# Patient Record
Sex: Male | Born: 1960
Health system: Southern US, Community
[De-identification: ages and names within clinical notes are randomized; demographics above are authoritative.]

## PROBLEM LIST (undated history)

## (undated) DIAGNOSIS — T7840XA Allergy, unspecified, initial encounter: Secondary | ICD-10-CM

## (undated) DIAGNOSIS — E669 Obesity, unspecified: Secondary | ICD-10-CM

## (undated) DIAGNOSIS — M1990 Unspecified osteoarthritis, unspecified site: Secondary | ICD-10-CM

## (undated) DIAGNOSIS — I739 Peripheral vascular disease, unspecified: Secondary | ICD-10-CM

## (undated) DIAGNOSIS — K219 Gastro-esophageal reflux disease without esophagitis: Secondary | ICD-10-CM

## (undated) DIAGNOSIS — I10 Essential (primary) hypertension: Secondary | ICD-10-CM

## (undated) DIAGNOSIS — M25519 Pain in unspecified shoulder: Secondary | ICD-10-CM

## (undated) DIAGNOSIS — C801 Malignant (primary) neoplasm, unspecified: Secondary | ICD-10-CM

## (undated) DIAGNOSIS — R7309 Other abnormal glucose: Secondary | ICD-10-CM

## (undated) DIAGNOSIS — E785 Hyperlipidemia, unspecified: Secondary | ICD-10-CM

## (undated) DIAGNOSIS — J310 Chronic rhinitis: Secondary | ICD-10-CM

## (undated) DIAGNOSIS — M542 Cervicalgia: Secondary | ICD-10-CM

## (undated) HISTORY — PX: KNEE SURGERY: SHX244

## (undated) HISTORY — DX: Malignant (primary) neoplasm, unspecified: C80.1

## (undated) HISTORY — DX: Allergy, unspecified, initial encounter: T78.40XA

## (undated) HISTORY — DX: Other abnormal glucose: R73.09

## (undated) HISTORY — DX: Gastro-esophageal reflux disease without esophagitis: K21.9

## (undated) HISTORY — DX: Hyperlipidemia, unspecified: E78.5

## (undated) HISTORY — PX: WISDOM TOOTH EXTRACTION: SHX21

## (undated) HISTORY — DX: Peripheral vascular disease, unspecified: I73.9

## (undated) HISTORY — DX: Chronic rhinitis: J31.0

## (undated) HISTORY — DX: Obesity, unspecified: E66.9

## (undated) HISTORY — DX: Cervicalgia: M54.2

## (undated) HISTORY — DX: Unspecified osteoarthritis, unspecified site: M19.90

## (undated) HISTORY — PX: SHOULDER SURGERY: SHX246

## (undated) HISTORY — DX: Essential (primary) hypertension: I10

## (undated) HISTORY — DX: Pain in unspecified shoulder: M25.519

---

## 2000-12-08 ENCOUNTER — Encounter: Admission: RE | Admit: 2000-12-08 | Discharge: 2000-12-08 | Payer: Self-pay | Admitting: Emergency Medicine

## 2000-12-08 ENCOUNTER — Encounter: Payer: Self-pay | Admitting: Emergency Medicine

## 2001-03-20 ENCOUNTER — Encounter: Admission: RE | Admit: 2001-03-20 | Discharge: 2001-03-20 | Payer: Self-pay | Admitting: Neurosurgery

## 2001-03-27 ENCOUNTER — Encounter: Payer: Self-pay | Admitting: Neurosurgery

## 2001-03-27 ENCOUNTER — Encounter: Admission: RE | Admit: 2001-03-27 | Discharge: 2001-03-27 | Payer: Self-pay | Admitting: Neurosurgery

## 2001-04-20 ENCOUNTER — Encounter: Payer: Self-pay | Admitting: Neurosurgery

## 2001-04-20 ENCOUNTER — Inpatient Hospital Stay (HOSPITAL_COMMUNITY): Admission: RE | Admit: 2001-04-20 | Discharge: 2001-04-20 | Payer: Self-pay | Admitting: Neurosurgery

## 2004-11-02 ENCOUNTER — Ambulatory Visit (HOSPITAL_BASED_OUTPATIENT_CLINIC_OR_DEPARTMENT_OTHER): Admission: RE | Admit: 2004-11-02 | Discharge: 2004-11-02 | Payer: Self-pay | Admitting: Orthopedic Surgery

## 2004-11-02 ENCOUNTER — Ambulatory Visit (HOSPITAL_COMMUNITY): Admission: RE | Admit: 2004-11-02 | Discharge: 2004-11-02 | Payer: Self-pay | Admitting: Orthopedic Surgery

## 2006-09-28 ENCOUNTER — Ambulatory Visit: Payer: Self-pay | Admitting: Vascular Surgery

## 2006-11-14 ENCOUNTER — Ambulatory Visit: Payer: Self-pay | Admitting: Vascular Surgery

## 2007-09-05 ENCOUNTER — Ambulatory Visit: Payer: Self-pay | Admitting: Vascular Surgery

## 2008-01-02 DIAGNOSIS — I4510 Unspecified right bundle-branch block: Secondary | ICD-10-CM | POA: Insufficient documentation

## 2008-07-12 ENCOUNTER — Encounter: Payer: Self-pay | Admitting: Family Medicine

## 2008-09-03 ENCOUNTER — Ambulatory Visit: Payer: Self-pay | Admitting: Vascular Surgery

## 2008-11-20 ENCOUNTER — Ambulatory Visit: Payer: Self-pay | Admitting: Family Medicine

## 2008-11-20 DIAGNOSIS — E785 Hyperlipidemia, unspecified: Secondary | ICD-10-CM | POA: Insufficient documentation

## 2008-11-20 DIAGNOSIS — K219 Gastro-esophageal reflux disease without esophagitis: Secondary | ICD-10-CM

## 2008-11-20 DIAGNOSIS — R030 Elevated blood-pressure reading, without diagnosis of hypertension: Secondary | ICD-10-CM | POA: Insufficient documentation

## 2008-11-20 HISTORY — DX: Gastro-esophageal reflux disease without esophagitis: K21.9

## 2008-11-20 HISTORY — DX: Hyperlipidemia, unspecified: E78.5

## 2009-01-24 ENCOUNTER — Encounter: Payer: Self-pay | Admitting: Family Medicine

## 2009-01-24 DIAGNOSIS — E669 Obesity, unspecified: Secondary | ICD-10-CM

## 2009-01-24 DIAGNOSIS — I739 Peripheral vascular disease, unspecified: Secondary | ICD-10-CM | POA: Insufficient documentation

## 2009-01-24 DIAGNOSIS — Z87448 Personal history of other diseases of urinary system: Secondary | ICD-10-CM | POA: Insufficient documentation

## 2009-01-24 HISTORY — DX: Obesity, unspecified: E66.9

## 2009-01-24 HISTORY — DX: Peripheral vascular disease, unspecified: I73.9

## 2009-03-24 ENCOUNTER — Encounter: Admission: RE | Admit: 2009-03-24 | Discharge: 2009-03-24 | Payer: Self-pay | Admitting: Otolaryngology

## 2009-04-03 ENCOUNTER — Ambulatory Visit: Payer: Self-pay | Admitting: Family Medicine

## 2009-04-03 DIAGNOSIS — J310 Chronic rhinitis: Secondary | ICD-10-CM | POA: Insufficient documentation

## 2009-04-03 HISTORY — DX: Chronic rhinitis: J31.0

## 2009-06-04 ENCOUNTER — Encounter: Admission: RE | Admit: 2009-06-04 | Discharge: 2009-06-04 | Payer: Self-pay | Admitting: Occupational Medicine

## 2009-08-25 ENCOUNTER — Telehealth: Payer: Self-pay | Admitting: Family Medicine

## 2009-09-05 ENCOUNTER — Encounter: Admission: RE | Admit: 2009-09-05 | Discharge: 2009-09-05 | Payer: Self-pay | Admitting: Orthopedic Surgery

## 2009-09-10 ENCOUNTER — Ambulatory Visit: Payer: Self-pay | Admitting: Vascular Surgery

## 2009-11-05 ENCOUNTER — Encounter: Admission: RE | Admit: 2009-11-05 | Discharge: 2009-11-05 | Payer: Self-pay | Admitting: Orthopedic Surgery

## 2009-11-19 ENCOUNTER — Encounter: Admission: RE | Admit: 2009-11-19 | Discharge: 2009-11-19 | Payer: Self-pay | Admitting: Orthopedic Surgery

## 2009-11-19 ENCOUNTER — Encounter: Admission: RE | Admit: 2009-11-19 | Discharge: 2009-11-19 | Payer: Self-pay | Admitting: Neurological Surgery

## 2009-11-26 ENCOUNTER — Ambulatory Visit: Payer: Self-pay | Admitting: Family Medicine

## 2009-11-26 DIAGNOSIS — M25519 Pain in unspecified shoulder: Secondary | ICD-10-CM

## 2009-11-26 HISTORY — DX: Pain in unspecified shoulder: M25.519

## 2009-11-26 LAB — CONVERTED CEMR LAB: HDL goal, serum: 40 mg/dL

## 2009-12-17 ENCOUNTER — Ambulatory Visit: Payer: Self-pay | Admitting: Family Medicine

## 2009-12-17 DIAGNOSIS — M542 Cervicalgia: Secondary | ICD-10-CM | POA: Insufficient documentation

## 2009-12-17 HISTORY — DX: Cervicalgia: M54.2

## 2009-12-30 ENCOUNTER — Encounter: Payer: Self-pay | Admitting: Family Medicine

## 2010-01-24 ENCOUNTER — Encounter: Admission: RE | Admit: 2010-01-24 | Discharge: 2010-01-24 | Payer: Self-pay | Admitting: *Deleted

## 2010-03-13 ENCOUNTER — Ambulatory Visit: Payer: Self-pay | Admitting: Family Medicine

## 2010-03-13 LAB — CONVERTED CEMR LAB
ALT: 40 units/L (ref 0–53)
AST: 27 units/L (ref 0–37)
Albumin: 4.2 g/dL (ref 3.5–5.2)
Alkaline Phosphatase: 73 units/L (ref 39–117)
Basophils Absolute: 0 10*3/uL (ref 0.0–0.1)
Bilirubin, Direct: 0.2 mg/dL (ref 0.0–0.3)
Calcium: 9.3 mg/dL (ref 8.4–10.5)
Cholesterol: 128 mg/dL (ref 0–200)
Creatinine, Ser: 0.9 mg/dL (ref 0.4–1.5)
Eosinophils Relative: 7.6 % — ABNORMAL HIGH (ref 0.0–5.0)
Glucose, Urine, Semiquant: NEGATIVE
HCT: 48.4 % (ref 39.0–52.0)
Hemoglobin: 16.4 g/dL (ref 13.0–17.0)
Ketones, urine, test strip: NEGATIVE
LDL Cholesterol: 64 mg/dL (ref 0–99)
MCV: 86.3 fL (ref 78.0–100.0)
Monocytes Absolute: 0.5 10*3/uL (ref 0.1–1.0)
Neutro Abs: 3.5 10*3/uL (ref 1.4–7.7)
Neutrophils Relative %: 54.4 % (ref 43.0–77.0)
PSA: 1.39 ng/mL (ref 0.10–4.00)
Protein, U semiquant: NEGATIVE
RBC: 5.6 M/uL (ref 4.22–5.81)
Specific Gravity, Urine: 1.01
TSH: 0.6 microintl units/mL (ref 0.35–5.50)
Total CHOL/HDL Ratio: 4
Total Protein: 6.9 g/dL (ref 6.0–8.3)
Triglycerides: 153 mg/dL — ABNORMAL HIGH (ref 0.0–149.0)
VLDL: 30.6 mg/dL (ref 0.0–40.0)
WBC Urine, dipstick: NEGATIVE

## 2010-04-10 ENCOUNTER — Ambulatory Visit: Payer: Self-pay | Admitting: Family Medicine

## 2010-04-10 DIAGNOSIS — R21 Rash and other nonspecific skin eruption: Secondary | ICD-10-CM | POA: Insufficient documentation

## 2010-04-21 ENCOUNTER — Encounter: Payer: Self-pay | Admitting: Gastroenterology

## 2010-05-28 ENCOUNTER — Encounter (INDEPENDENT_AMBULATORY_CARE_PROVIDER_SITE_OTHER): Payer: Self-pay | Admitting: *Deleted

## 2010-05-29 ENCOUNTER — Ambulatory Visit: Payer: Self-pay | Admitting: Gastroenterology

## 2010-06-10 ENCOUNTER — Ambulatory Visit: Payer: Self-pay | Admitting: Gastroenterology

## 2010-06-12 ENCOUNTER — Ambulatory Visit: Payer: Self-pay | Admitting: Family Medicine

## 2010-06-12 ENCOUNTER — Encounter: Payer: Self-pay | Admitting: Gastroenterology

## 2010-06-12 DIAGNOSIS — R7303 Prediabetes: Secondary | ICD-10-CM | POA: Insufficient documentation

## 2010-06-12 DIAGNOSIS — R7309 Other abnormal glucose: Secondary | ICD-10-CM

## 2010-06-12 HISTORY — DX: Other abnormal glucose: R73.09

## 2010-09-02 ENCOUNTER — Telehealth: Payer: Self-pay | Admitting: Family Medicine

## 2010-09-23 ENCOUNTER — Ambulatory Visit: Admit: 2010-09-23 | Payer: Self-pay | Admitting: Vascular Surgery

## 2010-09-23 ENCOUNTER — Encounter (INDEPENDENT_AMBULATORY_CARE_PROVIDER_SITE_OTHER): Payer: 59

## 2010-09-23 ENCOUNTER — Ambulatory Visit (INDEPENDENT_AMBULATORY_CARE_PROVIDER_SITE_OTHER): Payer: 59 | Admitting: Vascular Surgery

## 2010-09-23 DIAGNOSIS — I70219 Atherosclerosis of native arteries of extremities with intermittent claudication, unspecified extremity: Secondary | ICD-10-CM

## 2010-09-23 DIAGNOSIS — I739 Peripheral vascular disease, unspecified: Secondary | ICD-10-CM

## 2010-09-24 NOTE — Assessment & Plan Note (Signed)
Summary: 2 MONTH ROV/PT WILL COME IN FASTING/NJR   Vital Signs:  Patient profile:   50 year old male Weight:      266 pounds Temp:     98.1 degrees F oral BP sitting:   144 / 90  (left arm) Cuff size:   large  Vitals Entered By: Sid Falcon LPN (June 12, 2010 8:39 AM) CBG Result 107   History of Present Illness: Patient for followup type 2 diabetes versus prediabetes. Fasting blood sugar recently 126. Patient made great lifestyle changes since then has lost 22 pounds. He has reduced calories mostly through beverages and dessert-type foods. Is also walking more. With weight loss has noticed  back pain is improved as well. Overall feels much better. No symptoms of hyperglycemia.  Allergies: 1)  ! Keflex (Cephalexin) 2)  ! Cephalexin (Cephalexin) 3)  ! Oxycodone Hcl (Oxycodone Hcl) 4)  ! Lipitor (Atorvastatin Calcium)  Past History:  Past Medical History: Last updated: 04/10/2010 Arthritis GERD Hyperlipidemia Hay Fever/Allergies Peripheral vascular disease ? 2002 erectile dysfunction RBB block on EKG 01/02/08 obesity Hyperglycemia 8/11 PMH reviewed for relevance  Review of Systems  The patient denies anorexia, chest pain, dyspnea on exertion, peripheral edema, and incontinence.    Physical Exam  General:  Well-developed,well-nourished,in no acute distress; alert,appropriate and cooperative throughout examination Mouth:  Oral mucosa and oropharynx without lesions or exudates.  Teeth in good repair. Lungs:  Normal respiratory effort, chest expands symmetrically. Lungs are clear to auscultation, no crackles or wheezes. Heart:  Normal rate and regular rhythm. S1 and S2 normal without gallop, murmur, click, rub or other extra sounds.   Impression & Recommendations:  Problem # 1:  PREDIABETES (ICD-790.29) blood sugar improved with fasting 107 today. Continue weight loss efforts. Glucose home monitor given with instructions for use. Reassess in 6 months.  Complete  Medication List: 1)  Mascadine Grape Seed Pill 1300mg   .... Once daily 2)  Crestor 10 Mg Tabs (Rosuvastatin calcium) .... 1/2 daily 3)  Fluticasone Propionate 50 Mcg/act Susp (Fluticasone propionate) .... 2 sprays per nostril q d 4)  Cialis 20 Mg Tabs (Tadalafil) .... One tab every other day as directed 5)  Hydrocodone-acetaminophen 5-325 Mg Tabs (Hydrocodone-acetaminophen) .... One to two tabs daily as needed 6)  Gabapentin 300 Mg Caps (Gabapentin) .... One tab at bedtime 7)  Ecotrin Low Strength 81 Mg Tbec (Aspirin) .... Once daily 8)  Allegra 180 Mg Tabs (Fexofenadine hcl) .... Otc once daily 9)  B-6 100mg   .... Once daily 10)  One-a-day Mens Tabs (Multiple vitamin) .... Once daily  Other Orders: Capillary Blood Glucose/CBG ([address])  Patient Instructions: 1)  Please schedule a follow-up appointment in 6 months .  2)  It is important that you exercise reguarly at least 20 minutes 5 times a week. If you develop chest pain, have severe difficulty breathing, or feel very tired, stop exercising immediately and seek medical attention.  3)  You need to lose weight. Consider a lower calorie diet and regular exercise.    Orders Added: 1)  Capillary Blood Glucose/CBG [82948] 2)  Est. Patient Level III [[address]454]

## 2010-09-24 NOTE — Letter (Signed)
Summary: [facility]  [facility]   Imported By: Maryln Gottron 01/12/2010 12:56:02  _____________________________________________________________________  External Attachment:    Type:   Image     Comment:   External Document

## 2010-09-24 NOTE — Letter (Signed)
Summary: Results Letter  [facility]  [address] [place], [place] [address]   Phone: [phone]  Fax: [phone]        June 12, 2010 MRN: [id number]    [facility] [address] [place], [place]  [address]    Dear Mr. Yellen,   Your biopsies demonstrated inflammatory changes only. I therefore recommend followup colonoscopy in 10 years     Please follow the recommendations previously discussed.  Should you have any immediate concerns or questions, feel free to contact me at the office.    Sincerely,  Barbette Hair. Arlyce Dice, M.D., [facility]          Sincerely,  Louis Meckel MD  This letter has been electronically signed by your physician.  Appended Document: Results Letter letter mailed

## 2010-09-24 NOTE — Assessment & Plan Note (Signed)
Summary: neck/shoulder pain x 3 month/njr   Vital Signs:  Patient profile:   50 year old male Weight:      278 pounds Temp:     98 degrees F oral Pulse rhythm:   regular BP sitting:   166 / 84  Vitals Entered By: Lynann Beaver CMA (December 17, 2009 2:59 PM)  Serial Vital Signs/Assessments:  Time      Position  BP       Pulse  Resp  Temp     By                     152/90                         Evelena Peat MD  CC: neck pain no better Is Patient Diabetic? No   History of Present Illness: Patient seen with persistent neck and upper back pain. Refer to prior note. He had several months of back and neck and shoulder pain since injury Sept. 2010.  Pt released by Occ med to work last week and progressive pain L neck and upper back to shoulder since then.  Worker's Compensation related injury. He's had extensive evaluations by orthopedist and occupational medicine physician. MRI of cervical spine in March. We do not have those results but this apparently showed some degenerative changes without disc herniation. He's tried multiple medications along with physical therapy without relief. Had epidural injections in his low back and one epidural in his neck without improvement.  Patient relates sharp neck pain which radiates toward the head and to the left shoulder. Pain is relatively constant. Worse with movement. Some associated numbness left hand in ulnar distribution. Question of some diffuse weakness left side. He requests neurosurgical referral at this point. Prior history of of the lumbo-sacral disc surgery x2 in the past.  Allergies: 1)  ! Keflex (Cephalexin) 2)  ! Cephalexin (Cephalexin) 3)  ! Oxycodone Hcl (Oxycodone Hcl) 4)  ! Lipitor (Atorvastatin Calcium)  Past History:  Past Medical History: Last updated: 01/24/2009 Arthritis GERD Hyperlipidemia Hay Fever/Allergies Peripheral vascular disease ? 2002 erectile dysfunction RBB block on EKG 01/02/08 obesity  Past  Surgical History: Last updated: 01/24/2009 L-4, L-5 disk surg 1995 L-5,S-1 disk surg 2002 Left shoulder surg, spurs, 2005 RMSF 12/81 Inguinal herniorrhaphy, right 8/89 septoplasty [address]  Social History: Last updated: 01/24/2009 Occupation:  city of [place] Married Alcohol use-yes socially Never Smoked  Review of Systems  The patient denies anorexia, fever, weight loss, weight gain, chest pain, peripheral edema, headaches, abdominal pain, incontinence, and muscle weakness.    Physical Exam  General:  Well-developed,well-nourished,in no acute distress; alert,appropriate and cooperative throughout examination Head:  Normocephalic and atraumatic without obvious abnormalities. No apparent alopecia or balding. Neck:  No deformities, masses, or tenderness noted. Lungs:  Normal respiratory effort, chest expands symmetrically. Lungs are clear to auscultation, no crackles or wheezes. Heart:  Normal rate and regular rhythm. S1 and S2 normal without gallop, murmur, click, rub or other extra sounds. Extremities:  No clubbing, cyanosis, edema, or deformity noted with normal full range of motion of all joints.   Neurologic:  deep tendon reflexes difficult to elicit upper extremities. Full strength upper extremities. Normal sensory function to touch. Skin:  no rashes. Cervical Nodes:  No lymphadenopathy noted Psych:  normally interactive, good eye contact, not anxious appearing, and not depressed appearing.     Impression & Recommendations:  Problem # 1:  CERVICALGIA (ICD-723.1)  recommend referral back to neurosurgery this time The following medications were removed from the medication list:    Adult Aspirin Ec Low Strength 81 Mg Tbec (Aspirin) ..... Once daily His updated medication list for this problem includes:    Aleve 220 Mg Caps (Naproxen sodium) .Marland Kitchen... 2 daily  Orders: Neurosurgeon Referral (Neurosurgeon)  Problem # 2:  ELEVATED BP READING WITHOUT DX HYPERTENSION  (ICD-796.2) Will check with home monitor and if > 140/90 by several home readings next couple of months consider med treatment.  Complete Medication List: 1)  Zegerid 40-1100 Mg Caps (Omeprazole-sodium bicarbonate) .... Once daily 2)  Mascadine Grape Seed Pill 1300mg   .... Once daily 3)  Crestor 10 Mg Tabs (Rosuvastatin calcium) .... 1/2 daily 4)  Fluticasone Propionate 50 Mcg/act Susp (Fluticasone propionate) .... 2 sprays per nostril q d 5)  Cialis 20 Mg Tabs (Tadalafil) .... One tab every other day as directed 6)  Aleve 220 Mg Caps (Naproxen sodium) .... 2 daily  Patient Instructions: 1)  Check your  Blood Pressure regularly . If it is above: 140/90  you should make an appointment.

## 2010-09-24 NOTE — Assessment & Plan Note (Signed)
Summary: shoulder pain/njr   Vital Signs:  Patient profile:   50 year old male Weight:      278 pounds Temp:     98.5 degrees F oral BP sitting:   160 / 90  (left arm) Cuff size:   regular  Vitals Entered By: Sid Falcon LPN (November 26, 452 3:50 PM) CC: Left shoulder pain, Lipid Management   History of Present Illness: Patient seen with left shoulder pain since work accident September 2010.  Patient slipped and fell face first going downhill and reportedly had right meniscus injury and had some lumbar and cervical spine pain and left shoulder pain since the accident. Extensive evaluation by orthopedist and reportedly has had right knee meniscus surgery along with MRI scans of LS spine and cervical spine. Has had multiple treatments including physical therapy and epidurals without much relief.  Current pain in his left shoulder radiating toward left base of neck. Described as sharp. Moderate intensity. Sometimes worse with movement. Occasional numbness left upper extremity Initial injury involved landing on shoulder. Using hydrocodone for pain. Reportedly getting ready to be released from physical therapy.  Needs refills Crestor. Tolerating well and compliant with therapy.  Lipid Management History:      Positive NCEP/ATP III risk factors include male age 63 years old or older and peripheral vascular disease.  Negative NCEP/ATP III risk factors include no family history for ischemic heart disease, non-tobacco-user status, non-hypertensive, no ASHD (atherosclerotic heart disease), no prior stroke/TIA, and no history of aortic aneurysm.     Allergies: 1)  ! Keflex (Cephalexin) 2)  ! Cephalexin (Cephalexin) 3)  ! Oxycodone Hcl (Oxycodone Hcl) 4)  ! Lipitor (Atorvastatin Calcium)  Past History:  Past Medical History: Last updated: 01/24/2009 Arthritis GERD Hyperlipidemia Hay Fever/Allergies Peripheral vascular disease ? 2002 erectile dysfunction RBB block on EKG  01/02/08 obesity PMH reviewed for relevance  Review of Systems  The patient denies anorexia, fever, weight gain, chest pain, syncope, dyspnea on exertion, peripheral edema, and headaches.         weight loss due to his efforts.  Physical Exam  General:  Well-developed,well-nourished,in no acute distress; alert,appropriate and cooperative throughout examination Head:  Normocephalic and atraumatic without obvious abnormalities. No apparent alopecia or balding. Neck:  No deformities, masses, or tenderness noted. Lungs:  Normal respiratory effort, chest expands symmetrically. Lungs are clear to auscultation, no crackles or wheezes. Heart:  Normal rate and regular rhythm. S1 and S2 normal without gallop, murmur, click, rub or other extra sounds. Extremities:  left shoulder reveals good range of motion. No pain with abduction or internal rotation. Minimal left a.c. tenderness. No bicipital tenderness. No weakness detected. Neurologic:  strength normal in all extremities and DTRs symmetrical and normal.   Skin:  Intact without suspicious lesions or rashes Cervical Nodes:  No lymphadenopathy noted Psych:  normally interactive, good eye contact, not anxious appearing, and not depressed appearing.     Impression & Recommendations:  Problem # 1:  SHOULDER PAIN (ICD-719.41) Assessment New doubt rotator cuff pathology. Suspect radiation from neck. Patient initially requesting x-rays but we have encouraged him to go to [facility] for this to avoid problems with coverage.  He sees orthopedist again in 2 weeks. His updated medication list for this problem includes:    Adult Aspirin Ec Low Strength 81 Mg Tbec (Aspirin) ..... Once daily  Problem # 2:  HYPERLIPIDEMIA (ICD-272.4) Assessment: Unchanged recheck labs with CPE.  Refilled med for 6 months His updated medication list for  this problem includes:    Crestor 10 Mg Tabs (Rosuvastatin calcium) ..... One half tablet once  daily  Complete Medication List: 1)  Zegerid 40-1100 Mg Caps (Omeprazole-sodium bicarbonate) .... Once daily 2)  Adult Aspirin Ec Low Strength 81 Mg Tbec (Aspirin) .... Once daily 3)  Mascadine Grape Seed Pill 1300mg   .... Once daily 4)  Crestor 10 Mg Tabs (Rosuvastatin calcium) .... One half tablet once daily 5)  Fluticasone Propionate 50 Mcg/act Susp (Fluticasone propionate) .... 2 sprays per nostril q d 6)  Cialis 20 Mg Tabs (Tadalafil) .... One tab every other day as directed  Lipid Assessment/Plan:      Based on NCEP/ATP III, the patient's risk factor category is "history of coronary disease, peripheral vascular disease, cerebrovascular disease, or aortic aneurysm".  The patient's lipid goals are as follows: Total cholesterol goal is 200; LDL cholesterol goal is 100; HDL cholesterol goal is 40; Triglyceride goal is 150.    Patient Instructions: 1)  Schedule complete physical examination after turning 49 Prescriptions: CRESTOR 10 MG TABS (ROSUVASTATIN CALCIUM) one half tablet once daily  #30 x 6   Entered and Authorized by:   Evelena Peat MD   Signed by:   Evelena Peat MD on 11/26/2009   Method used:   [facility]        [facility]  [place] [address]retail[facility]       [address] [place] [address]       [place], [place]  [address]       Ph: [phone] or [phone]       Fax: [phone]   RxID:   [id number]

## 2010-09-24 NOTE — Progress Notes (Signed)
Summary: Rx Cialis request  Phone Note From Pharmacy   [facility]  [place] [address] Call For: Rakhi Romagnoli  Summary of Call: Pharmacy faxed request for Cialis 20mg , one tab by mouth every other day as directed.  I do not see this med on pt med list Initial call taken by: Sid Falcon LPN,  August 25, 2009 2:29 PM  Follow-up for Phone Call        OK to add Cialis and refill for 6 months. Follow-up by: Evelena Peat MD,  August 25, 2009 6:18 PM  Additional Follow-up for Phone Call Additional follow up Details #1::        Rx sent to pharmacy Additional Follow-up by: Sid Falcon LPN,  August 26, 2009 8:38 AM    New/Updated Medications: CIALIS 20 MG TABS (TADALAFIL) one tab every other day as directed Prescriptions: CIALIS 20 MG TABS (TADALAFIL) one tab every other day as directed  #20 x 6   Entered by:   Sid Falcon LPN   Authorized by:   Evelena Peat MD   Signed by:   Sid Falcon LPN on 91/47/8295   Method used:   [facility]        [facility]  [place] [address] [facility]       [address]       [place], [place]  [address]       Ph: [phone] or [phone]       Fax: [phone]   RxID:   [id number]

## 2010-09-24 NOTE — Miscellaneous (Signed)
Summary: [facility] Previsit/prep  Clinical Lists Changes  Medications: Added new medication of MOVIPREP 100 GM  SOLR (PEG-KCL-NACL-NASULF-NA ASC-C) As per prep instructions. - Signed Rx of MOVIPREP 100 GM  SOLR (PEG-KCL-NACL-NASULF-NA ASC-C) As per prep instructions.;  #1 x 0;  Signed;  Entered by: Wyona Almas RN;  Authorized by: Louis Meckel MD;  Method used: [facility]  [place] [address] [place], [place], [place]  [address], Ph: [phone] or [phone], Fax: [phone] Observations: Added new observation of ALLERGY REV: Done (05/29/2010 13:27)    Prescriptions: MOVIPREP 100 GM  SOLR (PEG-KCL-NACL-NASULF-NA ASC-C) As per prep instructions.  #1 x 0   Entered by:   Wyona Almas RN   Authorized by:   Louis Meckel MD   Signed by:   Wyona Almas RN on 05/29/2010   Method used:   [facility]        [facility]  [place] [address] [facility]       [address]       [place], [place]  [address]       Ph: [phone] or [phone]       Fax: [phone]   RxID:   [id number]

## 2010-09-24 NOTE — Letter (Signed)
Summary: Pre Visit Letter Revised  [facility]  [address] [place], [place] [address]   Phone: [phone]  Fax: [phone]        04/21/2010 MRN: [id number] [facility] [address] [place], [place]  [address]             Procedure Date:  Jun 10, 2010   Welcome to the Gastroenterology Division at [facility].    You are scheduled to see a nurse for your pre-procedure visit on May 29, 2010 at 2pm on the 3rd floor at [facility], [address]  We ask that you try to arrive at our office 15 minutes prior to your appointment time to allow for check-in.  Please take a minute to review the attached form.  If you answer "Yes" to one or more of the questions on the first page, we ask that you call the person listed at your earliest opportunity.  If you answer "No" to all of the questions, please complete the rest of the form and bring it to your appointment.    Your nurse visit will consist of discussing your medical and surgical history, your immediate family medical history, and your medications.   If you are unable to list all of your medications on the form, please bring the medication bottles to your appointment and we will list them.  We will need to be aware of both prescribed and over the counter drugs.  We will need to know exact dosage information as well.    Please be prepared to read and sign documents such as consent forms, a financial agreement, and acknowledgement forms.  If necessary, and with your consent, a friend or relative is welcome to sit-in on the nurse visit with you.  Please bring your insurance card so that we may make a copy of it.  If your insurance requires a referral to see a specialist, please bring your referral form from your primary care physician.  No co-pay is required for this nurse visit.     If you cannot keep your appointment, please call [phone] to cancel or reschedule prior to your appointment date.  This  allows [place] the opportunity to schedule an appointment for another patient in need of care.    Thank you for choosing [facility] for your medical needs.  We appreciate the opportunity to care for you.  Please visit [place] at our website  to learn more about our practice.  Sincerely, The Gastroenterology Division

## 2010-09-24 NOTE — Procedures (Signed)
Summary: Colonoscopy  Patient: Paul [name] Note: All result statuses are Final unless otherwise noted.  Tests: (1) Colonoscopy (COL)   COL Colonoscopy           DONE     [facility]     [address]     [place], [place]  [address]           COLONOSCOPY PROCEDURE REPORT           PATIENT:  Paul [name]  MR#:  [id number]     BIRTHDATE:  September 04, 1960, 49 yrs. old  GENDER:  male           ENDOSCOPIST:  Barbette Hair. Arlyce Dice, MD     Referred by:  Evelena Peat, M.D.           PROCEDURE DATE:  06/10/2010     PROCEDURE:  Colon with cold biopsy polypectomy     ASA CLASS:  Class I     INDICATIONS:  1) Routine Risk Screening           MEDICATIONS:   Fentanyl 112.5 mcg IV, Versed 12 mg IV           DESCRIPTION OF PROCEDURE:   After the risks benefits and     alternatives of the procedure were thoroughly explained, informed     consent was obtained.  Digital rectal exam was performed and     revealed no abnormalities.   The LB 180AL [id number] endoscope was     introduced through the anus and advanced to the cecum, which was     identified by both the appendix and ileocecal valve, without     limitations.  The quality of the prep was excellent, using     MiraLax.  The instrument was then slowly withdrawn as the colon     was fully examined.     <<PROCEDUREIMAGES>>           FINDINGS:  A diminutive polyp was found in the sigmoid colon. It     was 2 mm in size. It was found 18 cm from the point of entry. The     polyp was removed using cold biopsy forceps (see image12).  This     was otherwise a normal examination of the colon (see image3,     image4, image7, image14, image5, image8, and image13).     Retroflexed views in the rectum revealed no abnormalities.    The     time to cecum =  4.50  minutes. The scope was then withdrawn (time     =  8.50  min) from the patient and the procedure completed.     COMPLICATIONS:  None           ENDOSCOPIC IMPRESSION:     1) 2 mm diminutive  polyp in the sigmoid colon     2) Otherwise normal examination     RECOMMENDATIONS:  1) If the polyp(s) removed today are proven to     be adenomatous (pre-cancerous) polyps, you will need a repeat     colonoscopy in 5 years. Otherwise you should continue to follow     colorectal cancer screening guidelines for "routine risk" patients     with colonosc     opy in 10 years.           REPEAT EXAM:   You will receive a letter from Dr. Arlyce Dice in 1-2     weeks, after reviewing the final pathology, with  followup     recommendations.           ______________________________     Barbette Hair Arlyce Dice, MD           CC:           n.     eSIGNED:   Barbette Hair. Kaplan at 06/10/2010 10:10 AM           Paul [name], [id number]  Note: An exclamation mark (!) indicates a result that was not dispersed into the flowsheet. Document Creation Date: 06/10/2010 10:10 AM _______________________________________________________________________  (1) Order result status: Final Collection or observation date-time: 06/10/2010 10:04 Requested date-time:  Receipt date-time:  Reported date-time:  Referring Physician:   Ordering Physician: Melvia Heaps [id number]) Specimen Source:  Source: Launa Grill Order Number: [id number] Lab site:   Appended Document: Colonoscopy Recall     Procedures Next Due Date:    Colonoscopy: 05/2020

## 2010-09-24 NOTE — Letter (Signed)
Summary: [facility] Instructions  [facility]  [address] [place], [place] [address]   Phone: [phone]  Fax: [phone]       EOIN WILLDEN    03-13-1971    MRN: [id number]        Procedure Day Dorna Bloom:  [facility]  06/10/10     Arrival Time:  8:30AM     Procedure Time:  9:30AM     Location of Procedure:                    _X _  [facility] (4th Floor)                       PREPARATION FOR COLONOSCOPY WITH MOVIPREP   Starting 5 days prior to your procedure 06/05/10 do not eat nuts, seeds, popcorn, corn, beans, peas,  salads, or any raw vegetables.  Do not take any fiber supplements (e.g. Metamucil, Citrucel, and Benefiber).  THE DAY BEFORE YOUR PROCEDURE         DATE: 06/09/10  DAY: TUESDAY  1.  Drink clear liquids the entire day-NO SOLID FOOD  2.  Do not drink anything colored red or purple.  Avoid juices with pulp.  No orange juice.  3.  Drink at least 64 oz. (8 glasses) of fluid/clear liquids during the day to prevent dehydration and help the prep work efficiently.  CLEAR LIQUIDS INCLUDE: Water Jello Ice Popsicles Tea (sugar ok, no milk/cream) Powdered fruit flavored drinks Coffee (sugar ok, no milk/cream) Gatorade Juice: apple, white grape, white cranberry  Lemonade Clear bullion, consomm, broth Carbonated beverages (any kind) Strained chicken noodle soup Hard Candy                             4.  In the morning, mix first dose of MoviPrep solution:    Empty 1 Pouch A and 1 Pouch B into the disposable container    Add lukewarm drinking water to the top line of the container. Mix to dissolve    Refrigerate (mixed solution should be used within 24 hrs)  5.  Begin drinking the prep at 5:00 p.m. The MoviPrep container is divided by 4 marks.   Every 15 minutes drink the solution down to the next mark (approximately 8 oz) until the full liter is complete.   6.  Follow completed prep with 16 oz of clear liquid of your choice  (Nothing red or purple).  Continue to drink clear liquids until bedtime.  7.  Before going to bed, mix second dose of MoviPrep solution:    Empty 1 Pouch A and 1 Pouch B into the disposable container    Add lukewarm drinking water to the top line of the container. Mix to dissolve    Refrigerate  THE DAY OF YOUR PROCEDURE      DATE: 06/10/10  DAY: WEDNESDAY  Beginning at 4:30AM (5 hours before procedure):         1. Every 15 minutes, drink the solution down to the next mark (approx 8 oz) until the full liter is complete.  2. Follow completed prep with 16 oz. of clear liquid of your choice.    3. You may drink clear liquids until 7:30AM (2 HOURS BEFORE PROCEDURE).   MEDICATION INSTRUCTIONS  Unless otherwise instructed, you should take regular prescription medications with a small sip of water   as early as possible the morning of  your procedure.        OTHER INSTRUCTIONS  You will need a responsible adult at least 51 years of age to accompany you and drive you home.   This person must remain in the waiting room during your procedure.  Wear loose fitting clothing that is easily removed.  Leave jewelry and other valuables at home.  However, you may wish to bring a book to read or  an iPod/MP3 player to listen to music as you wait for your procedure to start.  Remove all body piercing jewelry and leave at home.  Total time from sign-in until discharge is approximately 2-3 hours.  You should go home directly after your procedure and rest.  You can resume normal activities the  day after your procedure.  The day of your procedure you should not:   Drive   Make legal decisions   Operate machinery   Drink alcohol   Return to work  You will receive specific instructions about eating, activities and medications before you leave.    The above instructions have been reviewed and explained to me by   Wyona Almas RN  May 29, 2010 2:35 PM     I fully  understand and can verbalize these instructions _____________________________ Date _________

## 2010-09-24 NOTE — Progress Notes (Signed)
Summary: Freestyle test strips refill  Phone Note Call from Patient   Caller: Patient Call For: Evelena Peat MD Summary of Call: Pt requesting refill Freestyle lite test strips.  Rx sent, pt informed Initial call taken by: Sid Falcon LPN,  September 02, 2010 4:02 PM    New/Updated Medications: FREESTYLE LITE TEST  STRP (GLUCOSE BLOOD) check BS weekly as directed Prescriptions: FREESTYLE LITE TEST  STRP (GLUCOSE BLOOD) check BS weekly as directed  #50 x 3   Entered by:   Sid Falcon LPN   Authorized by:   Evelena Peat MD   Signed by:   Sid Falcon LPN on 16/05/9603   Method used:   [facility]  [place] [address]       [address]       [place], [place]  [address]       Ph: [phone] or [phone]       Fax: [phone]   RxID:   [id number]

## 2010-09-24 NOTE — Assessment & Plan Note (Signed)
Summary: cpx/njr/pt rsc/cjr   Vital Signs:  Patient profile:   50 year old male Height:      73 inches Weight:      288 pounds BMI:     38.13 Temp:     98.4 degrees F oral Pulse rate:   72 / minute Pulse rhythm:   regular Resp:     12 per minute BP sitting:   160 / 88  (left arm) Cuff size:   large  Vitals Entered By: Sid Falcon LPN (April 10, 2010 11:24 AM)  Serial Vital Signs/Assessments:  Time      Position  BP       Pulse  Resp  Temp     By                     130/82                         Evelena Peat MD   History of Present Illness: Here for CPE.    Has had ongoing neck, back, and R knee issues for  the past year and has seen several specialists regarding this.    PMH, SH, AND FH reviewed.  Pt has separate issues from CPE including:  Hyperlidemia on Crestor. no hx of CAD.  Needs refills.  Compliant with therapy. No side effects.  L forerm rash for several weeks.  Initially though ringworm.  Went to derm and given steroid cream without improvement.  Mostly nontender.  Now has some pustules. nonscaly.  No fever.  Also hx allergic rhinitis and needs refill of Fluticasone.  Clinical Review Panels:  Prevention   Last PSA:  1.39 (03/13/2010)  Immunizations   Last Tetanus Booster:  Td (12/20/2007)  Lipid Management   Cholesterol:  128 (03/13/2010)   LDL (bad choesterol):  64 (03/13/2010)   HDL (good cholesterol):  33.10 (03/13/2010)  Diabetes Management   Creatinine:  0.9 (03/13/2010)  CBC   WBC:  6.4 (03/13/2010)   RBC:  5.60 (03/13/2010)   Hgb:  16.4 (03/13/2010)   Hct:  48.4 (03/13/2010)   Platelets:  173.0 (03/13/2010)   MCV  86.3 (03/13/2010)   MCHC  33.9 (03/13/2010)   RDW  14.3 (03/13/2010)   PMN:  54.4 (03/13/2010)   Lymphs:  29.0 (03/13/2010)   Monos:  8.2 (03/13/2010)   Eosinophils:  7.6 (03/13/2010)   Basophil:  0.8 (03/13/2010)  Complete Metabolic Panel   Glucose:  126 (03/13/2010)   Sodium:  144 (03/13/2010)   Potassium:   4.3 (03/13/2010)   Chloride:  109 (03/13/2010)   CO2:  29 (03/13/2010)   BUN:  13 (03/13/2010)   Creatinine:  0.9 (03/13/2010)   Albumin:  4.2 (03/13/2010)   Total Protein:  6.9 (03/13/2010)   Calcium:  9.3 (03/13/2010)   Total Bili:  0.7 (03/13/2010)   Alk Phos:  73 (03/13/2010)   SGPT (ALT):  40 (03/13/2010)   SGOT (AST):  27 (03/13/2010)   Allergies: 1)  ! Keflex (Cephalexin) 2)  ! Cephalexin (Cephalexin) 3)  ! Oxycodone Hcl (Oxycodone Hcl) 4)  ! Lipitor (Atorvastatin Calcium)  Past History:  Family History: Last updated: 01/24/2009 Prostate CA  Grandfather Family History of Cardiovascular disorder, uncle  Social History: Last updated: 01/24/2009 Occupation:  [employer] [place] Married Alcohol use-yes socially Never Smoked  Risk Factors: Smoking Status: never (01/24/2009)  Past Medical History: Arthritis GERD Hyperlipidemia Hay Fever/Allergies Peripheral vascular disease ? 2002 erectile dysfunction RBB block  on EKG 01/02/08 obesity Hyperglycemia 8/11  Past Surgical History: L-4, L-5 disk surg 1995 L-5,S-1 disk surg 2002 Left shoulder surg, spurs, 2005 RMSF 12/81 Inguinal herniorrhaphy, right 8/89 septoplasty 1992 R knee arthroscopy torn meniscus PMH-FH-SH reviewed for relevance  Review of Systems       The patient complains of weight gain and suspicious skin lesions.  The patient denies anorexia, fever, weight loss, vision loss, decreased hearing, hoarseness, chest pain, syncope, dyspnea on exertion, prolonged cough, headaches, hemoptysis, abdominal pain, melena, hematochezia, severe indigestion/heartburn, hematuria, incontinence, genital sores, muscle weakness, depression, enlarged lymph nodes, and testicular masses.    Physical Exam  General:  Well-developed,well-nourished,in no acute distress; alert,appropriate and cooperative throughout examination Head:  Normocephalic and atraumatic without obvious abnormalities. No apparent alopecia or  balding. Eyes:  No corneal or conjunctival inflammation noted. EOMI. Perrla. Funduscopic exam benign, without hemorrhages, exudates or papilledema. Vision grossly normal. Ears:  External ear exam shows no significant lesions or deformities.  Otoscopic examination reveals clear canals, tympanic membranes are intact bilaterally without bulging, retraction, inflammation or discharge. Hearing is grossly normal bilaterally. Mouth:  Oral mucosa and oropharynx without lesions or exudates.  Teeth in good repair. Neck:  No deformities, masses, or tenderness noted. Lungs:  Normal respiratory effort, chest expands symmetrically. Lungs are clear to auscultation, no crackles or wheezes. Heart:  Normal rate and regular rhythm. S1 and S2 normal without gallop, murmur, click, rub or other extra sounds. Abdomen:  soft, non-tender, normal bowel sounds, no distention, no masses, no abdominal hernia, no hepatomegaly, and no splenomegaly.   Msk:  mild edema R knee. Extremities:  no pitting edema and no clubbing. Neurologic:  alert & oriented X3, cranial nerves II-XII intact, and strength normal in all extremities.   Skin:  L forearm approx 1 cm diameter circular area of erythema with few scattered pustules.  Nonscaly. Cervical Nodes:  No lymphadenopathy noted Psych:  normally interactive, good eye contact, not anxious appearing, and not depressed appearing.     Impression & Recommendations:  Problem # 1:  Preventive Health Care (ICD-V70.0) labs reviewed and signif for elev glucose and low HDL.  Needs to work on weight loss and exercise. Educ material given on lowering carbs.  Needs colonoscopy.  Problem # 2:  RASH AND OTHER NONSPECIFIC SKIN ERUPTION (ICD-782.1) Assessment: New pustular rash.  ?staph. Start Doxycycline.  Problem # 3:  HYPERLIPIDEMIA (ICD-272.4)  His updated medication list for this problem includes:    Crestor 10 Mg Tabs (Rosuvastatin calcium) .Marland Kitchen... 1/2 daily  Problem # 4:  RHINITIS  (ICD-472.0)  Complete Medication List: 1)  Mascadine Grape Seed Pill 1300mg   .... Once daily 2)  Crestor 10 Mg Tabs (Rosuvastatin calcium) .... 1/2 daily 3)  Fluticasone Propionate 50 Mcg/act Susp (Fluticasone propionate) .... 2 sprays per nostril q d 4)  Cialis 20 Mg Tabs (Tadalafil) .... One tab every other day as directed 5)  Hydrocodone-acetaminophen 5-500 Mg Tabs (Hydrocodone-acetaminophen) .... One to two tabs daily as needed 6)  Gabapentin 300 Mg Caps (Gabapentin) .... One tab at bedtime 7)  Ecotrin Low Strength 81 Mg Tbec (Aspirin) .... Once daily 8)  Allegra 180 Mg Tabs (Fexofenadine hcl) .... Otc once daily 9)  B-6 100mg   .... Once daily 10)  One-a-day Mens Tabs (Multiple vitamin) .... Once daily 11)  Doxycycline Hyclate 100 Mg Caps (Doxycycline hyclate) .... One by mouth two times a day for 10 days  Other Orders: Gastroenterology Referral (GI)  Patient Instructions: 1)  Please schedule a follow-up  appointment in 2 months-plan to come fasting. 2)  It is important that you exercise reguarly at least 20 minutes 5 times a week. If you develop chest pain, have severe difficulty breathing, or feel very tired, stop exercising immediately and seek medical attention.  3)  You need to lose weight. Consider a lower calorie diet and regular exercise.  Prescriptions: DOXYCYCLINE HYCLATE 100 MG CAPS (DOXYCYCLINE HYCLATE) one by mouth two times a day for 10 days  #20 x 0   Entered and Authorized by:   Evelena Peat MD   Signed by:   Evelena Peat MD on 04/10/2010   Method used:   [facility]  [place] [address] [facility]       [address]       [place], [place]  [address]       Ph: [phone] or [phone]       Fax: [phone]   RxID:   [id number] FLUTICASONE PROPIONATE 50 MCG/ACT SUSP (FLUTICASONE PROPIONATE) 2 sprays per nostril q d  #1 x 11   Entered and Authorized by:   Evelena Peat MD   Signed by:   Evelena Peat MD on 04/10/2010   Method used:    [facility]        [facility]  [place] [address] [facility]       [address]       [place], [place]  [address]       Ph: [phone] or [phone]       Fax: [phone]   RxID:   [id number] CRESTOR 10 MG TABS (ROSUVASTATIN CALCIUM) 1/2 daily  #30 x 5   Entered and Authorized by:   Evelena Peat MD   Signed by:   Evelena Peat MD on 04/10/2010   Method used:   [facility]        [facility]  [place] [address]       [address]       [place], [place]  [address]       Ph: [phone] or [phone]       Fax: [phone]   RxID:   [id number]

## 2010-10-01 ENCOUNTER — Encounter: Payer: Self-pay | Admitting: Family Medicine

## 2010-10-01 ENCOUNTER — Ambulatory Visit (INDEPENDENT_AMBULATORY_CARE_PROVIDER_SITE_OTHER): Payer: 59 | Admitting: Family Medicine

## 2010-10-01 VITALS — BP 120/90 | Temp 98.0°F | Ht 73.0 in | Wt 277.0 lb

## 2010-10-01 DIAGNOSIS — M79609 Pain in unspecified limb: Secondary | ICD-10-CM

## 2010-10-01 DIAGNOSIS — M79603 Pain in arm, unspecified: Secondary | ICD-10-CM

## 2010-10-01 MED ORDER — DICLOFENAC SODIUM 75 MG PO TBEC: 75.0000 mg | DELAYED_RELEASE_TABLET | Freq: Two times a day (BID) | ORAL | Status: DC

## 2010-10-01 NOTE — Progress Notes (Signed)
  Subjective:    Patient ID: Paul [name], male    DOB: 07-01-1961, 50 y.o.   MRN: [id number]  HPI  Patient seen with right arm and possible shoulder injury. This occurred earlier today when he had his right hand on  Steering wheel and was reaching out of his vehicle to grab newspaper with left hand. Felt popping sensation right triceps and deltoid area. Gradual worsening of pain through the day. No ecchymosis. No visible swelling. No icing. Took 4 Advil without improvement. Denies neck pain pain.  worse with abduction and gripping.   No numbness or weakness   Review of Systems Denies any headache, adenopathy, chest pain, dyspnea.    Objective:   Physical Exam  Patient alert in no distress  Neck exam reveals full range of motion. No masses.  Extremities right shoulder full range of motion. No visible swelling right arm.   Patient has some poorly localized mild tenderness right deltoid region. No bicipital tenderness.  No pain with triceps use or extension of forearm. Strength full throughout right upper extremity. Chest CTA Heart RRR without murmur    Assessment & Plan:  R arm pain, ?deltoid muscle strain.  Doubt rotator cuff tear Diclofenac 75 mg po bid with food prn

## 2010-10-01 NOTE — Patient Instructions (Signed)
Be in touch if arm/shoulder no better in 2-3 weeks.

## 2010-10-02 NOTE — Consult Note (Signed)
NEW PATIENT CONSULTATION  Paul [name], Paul [name] DOB:  January 24, 1961                                       09/23/2010 [id number]#:[id number]  I saw Paul [name] in the office concerning his peripheral vascular disease.  I last saw him in February 2008.  He has known infrainguinal arterial occlusive disease which our lab has been following.  Since I saw him last in 2008, his claudication symptoms in the left calf have remained stable.  He has pain in the left calf which is brought on by ambulation and relieved with rest.  He states that if he is walking on level ground, he can walk [name] good distance before experiencing symptoms but typically experiences symptoms when he is walking uphill.  He has had no symptoms in the right leg and he has had no significant thigh or hip claudication on the left.  He has no history of rest pain and no history of nonhealing wounds.  His past medical history is significant for hypercholesterolemia and he is followed closely by Dr. Caryl [name].  He had no history of diabetes, hypertension, history of previous myocardial infarction, history congestive heart failure or history of COPD.  SOCIAL HISTORY:  He is married.  He does not have any children.  He does not use tobacco.  FAMILY HISTORY:  He has an uncle who had [name] heart attack in his 37s.  He is unaware of any other history of premature cardiovascular disease.  REVIEW OF SYSTEMS:  GENERAL:  He lost approximately 30 pounds intentionally with good nutrition. CARDIOVASCULAR:  He has had no chest pain, chest pressure, palpitations or arrhythmias.  He has had no history of stroke or TIAs.  He has had no history of DVT. MUSCULOSKELETAL:  He does have some arthritis in his back. GI, neurologic, pulmonary, hematologic, GU, ENT, psychiatric, integumentary review of systems is unremarkable and is documented on the medical history form in his chart.  PHYSICAL EXAMINATION:  This is [name] pleasant 50 year old  gentleman who appears his stated age. Blood pressure 136/91, heart rate is 89, saturation 100%. HEENT:  Unremarkable. LUNGS:  Clear bilaterally to auscultation without rales, rhonchi or wheezing. CARDIOVASCULAR:  I do not detect any carotid bruits.  He has [name] regular rate and rhythm.  He has palpable femoral pulses and palpable pedal pulses on the right and [name] palpable posterior tibial pulse on the left. He has no significant lower extremity swelling. ABDOMEN:  Soft, nontender with normal pitched bowel sounds. MUSCULOSKELETAL:  There are no major deformities or cyanosis. NEUROLOGIC:  He has no focal weakness or paresthesias. SKIN:  There are no ulcers or rashes.  I did independently interpret his arterial Doppler study which shows an ABI of 100% on the right with triphasic Doppler signals in the dorsalis pedis and posterior tibial positions.  On the left side, he has an ABI of 79% with triphasic flow in the posterior tibial artery and monophasic flow in the dorsalis pedis artery.  Overall he seems to be doing quite well and his infrainguinal arterial occlusive disease on the left is stable.  He has lost weight and remains active and seems highly motivated to continue to take excellent care of himself.  I have ordered [name] follow-up Doppler study in 1 year.  I plan on seeing him back in 2 years unless there is any significant change in his  Doppler study next year.  In the meantime he is followed closely by Dr. Caryl [name].    Paul [name]. Paul [name], M.D. Electronically Signed  CSD/MEDQ  D:  09/23/2010  T:  09/24/2010  Job:  [id number]  cc:   Paul [name], M.D.

## 2010-11-04 LAB — GLUCOSE, CAPILLARY: Glucose-Capillary: 133 mg/dL — ABNORMAL HIGH (ref 70–99)

## 2010-11-30 ENCOUNTER — Encounter: Payer: Self-pay | Admitting: Family Medicine

## 2010-11-30 ENCOUNTER — Ambulatory Visit (INDEPENDENT_AMBULATORY_CARE_PROVIDER_SITE_OTHER): Payer: 59 | Admitting: Family Medicine

## 2010-11-30 VITALS — BP 130/78 | Temp 98.9°F

## 2010-11-30 DIAGNOSIS — R51 Headache: Secondary | ICD-10-CM

## 2010-11-30 DIAGNOSIS — R059 Cough, unspecified: Secondary | ICD-10-CM

## 2010-11-30 DIAGNOSIS — B9689 Other specified bacterial agents as the cause of diseases classified elsewhere: Secondary | ICD-10-CM

## 2010-11-30 DIAGNOSIS — R05 Cough: Secondary | ICD-10-CM

## 2010-11-30 MED ORDER — AMOXICILLIN-POT CLAVULANATE 875-125 MG PO TABS: 1.0000 | ORAL_TABLET | Freq: Two times a day (BID) | ORAL | Status: DC

## 2010-11-30 NOTE — Patient Instructions (Signed)

## 2010-11-30 NOTE — Progress Notes (Signed)
  Subjective:    Patient ID: Paul [name], male    DOB: 07/12/1961, 50 y.o.   MRN: [id number]  HPI Patient is in with progressive sinus symptoms past couple weeks. Initially some allergies. Now progressive headaches frontal and maxillary sinus pressure and intermittent  chills and fever started 2 days ago. Cough has been mostly dry. He has tried Advil and nasal decongestants without much improvement. No dyspnea. Denies any nausea, vomiting, or diarrhea. No sore throat.   Review of Systems  Constitutional: Positive for fever, chills and fatigue.  HENT: Positive for congestion and sinus pressure. Negative for nosebleeds and sore throat.   Respiratory: Positive for cough. Negative for shortness of breath.   Cardiovascular: Negative for chest pain, palpitations and leg swelling.  Neurological: Positive for headaches. Negative for syncope.       Objective:   Physical Exam  Constitutional: He appears well-developed and well-nourished.  HENT:  Head: Normocephalic and atraumatic.  Right Ear: External ear normal.  Left Ear: External ear normal.  Mouth/Throat: Oropharynx is clear and moist. No oropharyngeal exudate.  Eyes: Conjunctivae are normal. Pupils are equal, round, and reactive to light. Right eye exhibits no discharge. Left eye exhibits no discharge.  Neck: Neck supple.  Cardiovascular: Normal rate and regular rhythm.   No murmur heard. Pulmonary/Chest: Effort normal and breath sounds normal. He has no wheezes. He has no rales.  Musculoskeletal: He exhibits no edema.  Lymphadenopathy:    He has no cervical adenopathy.          Assessment & Plan:  Patient has likely acute sinusitis. Start Augmentin 875 mg twice a day for 10 days

## 2010-12-02 ENCOUNTER — Telehealth: Payer: Self-pay | Admitting: *Deleted

## 2010-12-02 MED ORDER — HYDROCODONE-HOMATROPINE 5-1.5 MG/5ML PO SYRP: 5.0000 mL | ORAL_SOLUTION | Freq: Four times a day (QID) | ORAL | Status: DC | PRN

## 2010-12-02 MED ORDER — AZITHROMYCIN 250 MG PO TABS: ORAL_TABLET | ORAL | Status: AC

## 2010-12-02 NOTE — Telephone Encounter (Signed)
Cough meds? And RX for fever blister?????

## 2010-12-02 NOTE — Telephone Encounter (Signed)
Temp. 100.8 and chills also today.  Pt states he can take the codeine.

## 2010-12-02 NOTE — Telephone Encounter (Signed)
Discontinue Augmentin and start Zithromax (Z-pack) for 5 days.

## 2010-12-02 NOTE — Telephone Encounter (Signed)
Pt is having extreme diarrhea from Augmentin.  Lips have broken out with fever blisters, and is coughing all night.  Would like different antibiotic, cough meds and RX for lips.

## 2010-12-02 NOTE — Telephone Encounter (Signed)
May call in some Hycodan !20 ml one tsp po q 6 hours prn cough.  He has hx nausea reported with oxycodone. Make sure no intolerance to hydrocodone.  Would not recommend any specific rx for fever blister.

## 2010-12-04 ENCOUNTER — Telehealth: Payer: Self-pay | Admitting: *Deleted

## 2010-12-04 ENCOUNTER — Encounter: Payer: Self-pay | Admitting: *Deleted

## 2010-12-04 NOTE — Telephone Encounter (Signed)
Pt is calling again for an out of work note, please.

## 2010-12-04 NOTE — Telephone Encounter (Signed)
Pt needs to talk to [place] about an out of work note, please.

## 2010-12-04 NOTE — Telephone Encounter (Signed)
Pt requesting out of work note for Monday, Tuesday, Wednesday and Thursday (he has not worked his 4 10 hour days all week due to illness)

## 2010-12-04 NOTE — Telephone Encounter (Signed)
OK per Dr Caryl Never, letter done, pt informed OK

## 2010-12-15 ENCOUNTER — Telehealth: Payer: Self-pay | Admitting: *Deleted

## 2010-12-15 MED ORDER — HYDROCODONE-HOMATROPINE 5-1.5 MG/5ML PO SYRP: 5.0000 mL | ORAL_SOLUTION | Freq: Four times a day (QID) | ORAL | Status: DC | PRN

## 2010-12-15 NOTE — Telephone Encounter (Signed)
Requesting Hycodan for residual cough.  [facility]

## 2010-12-15 NOTE — Telephone Encounter (Signed)
Called to [facility] [place] for refill of Hycodan 120 ml.

## 2010-12-15 NOTE — Telephone Encounter (Signed)
OK to refill once for 120 ml.

## 2010-12-18 ENCOUNTER — Ambulatory Visit (INDEPENDENT_AMBULATORY_CARE_PROVIDER_SITE_OTHER): Payer: 59 | Admitting: Family Medicine

## 2010-12-18 ENCOUNTER — Encounter: Payer: Self-pay | Admitting: Family Medicine

## 2010-12-18 VITALS — BP 134/80 | Temp 97.7°F | Wt 276.0 lb

## 2010-12-18 DIAGNOSIS — B001 Herpesviral vesicular dermatitis: Secondary | ICD-10-CM

## 2010-12-18 DIAGNOSIS — B009 Herpesviral infection, unspecified: Secondary | ICD-10-CM

## 2010-12-18 DIAGNOSIS — R059 Cough, unspecified: Secondary | ICD-10-CM

## 2010-12-18 DIAGNOSIS — R05 Cough: Secondary | ICD-10-CM

## 2010-12-18 MED ORDER — VALACYCLOVIR HCL 1 G PO TABS: ORAL_TABLET | ORAL | Status: DC

## 2010-12-18 NOTE — Progress Notes (Signed)
  Subjective:    Patient ID: Paul [name], male    DOB: 02-27-1961, 50 y.o.   MRN: [id number]  HPI Patient seen with persistent cough. Cough is dry. Recent sinus infection. Questionable reaction to Augmentin. Switched to [employer]. Overall feels better. Headache has resolved. No facial pain. No colored nasal discharge. Has persistent cough, especially at night. Hycodan cough syrup helps. Some postnasal drip symptoms.  Recurrent cold sores. Would like to consider prophylactic treatment for future cold sores. Usually gets these about 2-3 times per year   Review of Systems  Constitutional: Negative for fever and chills.  HENT: Positive for postnasal drip. Negative for sore throat, sneezing, voice change and sinus pressure.   Respiratory: Positive for cough. Negative for shortness of breath and wheezing.   Cardiovascular: Negative for chest pain, palpitations and leg swelling.       Objective:   Physical Exam  Constitutional: He appears well-developed and well-nourished. No distress.  HENT:  Right Ear: External ear normal.  Left Ear: External ear normal.  Mouth/Throat: Oropharynx is clear and moist. No oropharyngeal exudate.  Neck: No thyromegaly present.  Cardiovascular: Normal rate, regular rhythm and normal heart sounds.   No murmur heard. Pulmonary/Chest: Effort normal and breath sounds normal. No respiratory distress. He has no wheezes. He has no rales.  Musculoskeletal: He exhibits no edema.  Lymphadenopathy:    He has no cervical adenopathy.          Assessment & Plan:  #1 cough. Probably related to recent acute sinusitis. Try over-the-counter Comtrex for postnasal drip symptoms continue Hycodan for cough suppression #2 recurrent cold sores. Valtrex 1 g 2  at onset cold sore and 2 12 hours later

## 2010-12-18 NOTE — Patient Instructions (Signed)
Try Comtrex (over the counter) for postnasal drip symptoms.

## 2010-12-20 ENCOUNTER — Encounter: Payer: Self-pay | Admitting: Family Medicine

## 2011-01-05 NOTE — Procedures (Signed)
CAROTID DUPLEX EXAM   INDICATION:  Carotid artery disease.   HISTORY:  Diabetes:  No.  Cardiac:  No.  Hypertension:  No.  Smoking:  No.  Previous Surgery:  No.  CV History:  No.  Amaurosis Fugax No, Paresthesias No, Hemiparesis No.                                       RIGHT             LEFT  Brachial systolic pressure:         175               163  Brachial Doppler waveforms:         Within normal limits                Within normal limits  Vertebral direction of flow:        Antegrade         Antegrade  DUPLEX VELOCITIES (cm/sec)  CCA peak systolic                   131               156  ECA peak systolic                   182               146  ICA peak systolic                   95                88  ICA end diastolic                   32                33  PLAQUE MORPHOLOGY:                  None              None  PLAQUE AMOUNT:                      None              None  PLAQUE LOCATION:                    None              None   IMPRESSION:  Patent bilateral internal carotid arteries with no  stenosis.   ___________________________________________  Di Kindle. Edilia Bo, M.D.   AC/MEDQ  D:  09/03/2008  T:  09/03/2008  Job:  [id number]

## 2011-01-08 NOTE — Op Note (Signed)
Paul [name], Paul [name]                 ACCOUNT NO.:  [id number]   MEDICAL RECORD NO.:  [id number]          PATIENT TYPE:  AMB   LOCATION:  DSC                          FACILITY:  [facility]   PHYSICIAN:  Feliberto Gottron. Turner Daniels, M.D.   DATE OF BIRTH:  August 31, 1960   DATE OF PROCEDURE:  11/02/2004  DATE OF DISCHARGE:                                 OPERATIVE REPORT   REFERRING PHYSICIAN:  Evelena Peat, M.D.   PREOPERATIVE DIAGNOSIS:  Left shoulder impingement syndrome.   POSTOPERATIVE DIAGNOSIS:  Left shoulder impingement syndrome with  subacromial and subclavicular spurs.   PROCEDURE:  Left shoulder arthroscopic anterior inferior acromioplasty and  distal clavicle spur excision.   SURGEON:  Feliberto Gottron. Turner Daniels, M.D.   FIRST ASSISTANT:  Erskine Squibb B. Su Hilt, P.A.C.   ANESTHETIC:  Endotracheal.   ESTIMATED BLOOD LOSS:  Minimal.   FLUID REPLACEMENT:  800 mL  crystalloid.   DRAINS PLACED:  None.   TOURNIQUET TIME:  None.   INDICATIONS FOR PROCEDURE:  A 50 year old man with left shoulder impingement  syndrome since an injury at work that has been treated on and off for the  last five or six months. In any event, because of persistent impingement  type pain, he is taken for arthroscopic evaluation treatment of his left  shoulder. MRI scan showed tendinosis but no partial or full-thickness tears  the rotator cuff.   DESCRIPTION OF PROCEDURE:  The patient identified by armband, taken the  operating room at [facility]. Appropriate anesthetic  monitors were attached and general endotracheal anesthesia induced with the  patient in supine position. He was then placed in the beach chair position  and the left upper extremity prepped and draped in the usual sterile fashion  from the wrist to the hemithorax. Using a #11 blade, standard portals were  then made 1.5 cm anterior to the [facility] joint, lateral to the junction of the  middle and posterior thirds of the acromion, posterior to the  posterolateral  corner of the acromion process. The inflow was placed anteriorly, the  arthroscope laterally and a 4.2 great white sucker shaver posteriorly  allowing subacromial bursectomy.  The rotator cuff, although was somewhat  abraded, was intact.  Maybe 5% of the fibers were damage by the subacromial  spur which was then outlined with the great white sucker shaver and the  underwater electrocautery used to control any hemorrhage. The subacromial  spur was then removed with a 4.5 hooded vortex bur from the posterior portal  creating a type I acromion. This also exposed a subclavicular spur that was  quite prominent and likewise was then removed with a 4.5 hooded vortex bur.  We made further documentation of the intact nature of the rotator cuff and  then arthroscope was then inserted into the glenohumeral joint using a  posterior portal with the inflow attached. Diagnostic arthroscopy revealed a  normal appearing cartilage of the glenohumeral joint. The biceps, the biceps  anchor, subscapularis and the rest the rotator cuff insertions were all  intact. The shoulder was then irrigated out with normal saline  solution. The  arthroscopic instruments removed and a dressing of Xeroform, 4x4 dressing  sponges, paper tape and a sling applied. The patient was then laid supine,  awakened and taken to the recovery room without difficulty      FJR/MEDQ  D:  11/02/2004  T:  11/02/2004  Job:  [id number]   cc:   Evelena Peat, M.D.  [address]  [place]  [place] [address]  Fax: [phone]

## 2011-01-08 NOTE — Letter (Signed)
January 25, 2007   Jake Michaelis  Underwriting at [facility], [address]  [place], [place] [address]   Re:  Geurin, Hayward                   DOB:   I had seen Mr. Gilham initially in consultation in March of 2003 with  claudication of his left lower extremity.  At that time ankle-brachial  index was 50% on the left.  He had no symptoms in the right leg.  In  addition he had palpable pedal pulses on the right side.  I have been  following him intermittently since that time and on his most recent  followup visit in February of 2008 he had only mild claudication in the  left calf at 400-500 yard with no symptoms in the right leg.  Doppler  study in our office of February of this year shows that his ankle-  brachial index is no 63% on the left which is actually improved compared  to his initial study back in 2003.  ABI on the right is 100%.  This  patient has mild stable claudication in the left leg which should not  present a long-term health problem for him.  He is a highly motivated  individual who has remained active and is not a smoker.  I really, from  my standpoint, can not see any reason why he would be denied long-term  care insurance.   Di Kindle. Edilia Bo, M.D.  Electronically Signed   CSD/MEDQ  D:  01/25/2007  T:  01/26/2007  Job:  [id number]   cc:   Ike Bene

## 2011-01-08 NOTE — Op Note (Signed)
[place]  Patient:    Paul [name], Paul [name] Visit Number: [id number] MRN: [id number]          Service Type: SUR Location: 3000 [id number] [id number] Attending Physician:  Gerald Dexter Proc. Date: 04/20/01 Admit Date:  04/20/2001 Discharge Date: 04/20/2001                             Operative Report  PREOPERATIVE DIAGNOSIS:  Herniated disk, L4-5 right.  POSTOPERATIVE DIAGNOSIS:  Herniated disk, L4-5 right.  OPERATION PERFORMED:  Right L4-5 interlaminar laminotomy for excision of herniated disk with operating microscope.  SECONDARY PROCEDURE:  Microdissection L4-5 disk and L5 nerve root.  SURGEON:  Reinaldo Meeker, M.D.  ASSISTANT:  Donalee Citrin, Montez Hageman.  ANESTHESIA:  DESCRIPTION OF PROCEDURE:  After being placed in the prone position, the patients back was prepped and draped in the usual sterile fashion.  A localizing x-ray was taken prior to incision to identify the L4-5 level. Midline incision was made above the spinous process of L4 and L5.  Using Bovie cutting current, the incision was carried down to spinous processes. Subperiosteal dissection was then carried out on the right-sided spinous processes and lamina and a McCullough self-retaining retractor was placed for exposure.  A second x-ray was taken to confirm approach to the L4-5 level and this was correct.  Using a high speed drill the inferior one third of the L4 lamina and medial one third of the facet joint were removed.  The drill was then used to remove the superior one third of the L5 lamina.  Residual bone and ligamentum flavum were removed in piecemeal fashion.  The microscope was draped, brought into the field and used for the remainder of the case.  Using microdissection technique, the lateral aspect of the thecal sac and L5 nerve root were identified.  Photocoagulation was carried out down to the floor of the canal to identify the L4-5 disk which was found to be markedly  herniated. After coagulating on the annulus, the annulus was incised with a 15 blade. Using pituitary rongeurs and curets, a very thorough disk space clean-out was carried out.  At the same time great care was taken to avoid injury to the neural elements and this was successfully done.  At this point inspection was carried out in all directions for any evidence of residual compression and none could be identified.  Large amounts of irrigation were carried out and any bleeding controlled with bipolar coagulation and Gelfoam.  The wound was then closed using interrupted Vicryl in the muscle, fascia, subcutaneous and subcuticular tissues and Dermabond on the skin followed by Steri-Strips.  A sterile dressing was then applied.  The patient was extubated and taken to the recovery room in stable condition. Attending Physician:  Gerald Dexter DD:  04/20/01 TD:  04/20/01 Job: [id number] [id number]

## 2011-04-27 ENCOUNTER — Other Ambulatory Visit: Payer: Self-pay | Admitting: Family Medicine

## 2011-06-21 ENCOUNTER — Ambulatory Visit
Admission: RE | Admit: 2011-06-21 | Discharge: 2011-06-21 | Disposition: A | Discharge status: Home / Self Care | Payer: 59 | Source: Ambulatory Visit | Attending: Family Medicine | Admitting: Family Medicine

## 2011-06-21 ENCOUNTER — Other Ambulatory Visit: Payer: Self-pay | Admitting: Family Medicine

## 2011-08-11 ENCOUNTER — Emergency Department (INDEPENDENT_AMBULATORY_CARE_PROVIDER_SITE_OTHER): Payer: Worker's Compensation

## 2011-08-11 ENCOUNTER — Emergency Department (HOSPITAL_BASED_OUTPATIENT_CLINIC_OR_DEPARTMENT_OTHER)
Admission: EM | Admit: 2011-08-11 | Discharge: 2011-08-11 | Disposition: A | Discharge status: Home / Self Care | Payer: Worker's Compensation | Attending: Emergency Medicine | Admitting: Emergency Medicine

## 2011-08-11 ENCOUNTER — Encounter (HOSPITAL_BASED_OUTPATIENT_CLINIC_OR_DEPARTMENT_OTHER): Payer: Self-pay

## 2011-08-11 DIAGNOSIS — M542 Cervicalgia: Secondary | ICD-10-CM

## 2011-08-11 DIAGNOSIS — S139XXA Sprain of joints and ligaments of unspecified parts of neck, initial encounter: Secondary | ICD-10-CM | POA: Insufficient documentation

## 2011-08-11 DIAGNOSIS — S161XXA Strain of muscle, fascia and tendon at neck level, initial encounter: Secondary | ICD-10-CM

## 2011-08-11 DIAGNOSIS — E785 Hyperlipidemia, unspecified: Secondary | ICD-10-CM | POA: Insufficient documentation

## 2011-08-11 DIAGNOSIS — Y92410 Unspecified street and highway as the place of occurrence of the external cause: Secondary | ICD-10-CM | POA: Insufficient documentation

## 2011-08-11 DIAGNOSIS — M25519 Pain in unspecified shoulder: Secondary | ICD-10-CM | POA: Insufficient documentation
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
## 2011-08-12 NOTE — ED Provider Notes (Signed)
Medical screening examination/treatment/procedure(s) were performed by non-physician practitioner and as supervising physician I was immediately available for consultation/collaboration.   Jibran Crookshanks A. Chea Malan, MD 08/12/11 0701 

## 2011-10-29 ENCOUNTER — Encounter (INDEPENDENT_AMBULATORY_CARE_PROVIDER_SITE_OTHER): Payer: 59 | Admitting: *Deleted

## 2011-10-29 DIAGNOSIS — I739 Peripheral vascular disease, unspecified: Secondary | ICD-10-CM

## 2011-11-05 ENCOUNTER — Telehealth: Payer: Self-pay | Admitting: Family Medicine

## 2011-11-05 ENCOUNTER — Encounter: Payer: Self-pay | Admitting: Family Medicine

## 2011-11-05 ENCOUNTER — Ambulatory Visit (INDEPENDENT_AMBULATORY_CARE_PROVIDER_SITE_OTHER): Payer: 59 | Admitting: Family Medicine

## 2011-11-05 VITALS — BP 140/90 | Temp 97.5°F | Wt 286.0 lb

## 2011-11-05 DIAGNOSIS — I10 Essential (primary) hypertension: Secondary | ICD-10-CM

## 2011-11-05 MED ORDER — FLUTICASONE PROPIONATE 50 MCG/ACT NA SUSP: 1.0000 | Freq: Every day | NASAL | Status: DC

## 2011-11-05 MED ORDER — LISINOPRIL 10 MG PO TABS: 10.0000 mg | ORAL_TABLET | Freq: Every day | ORAL | Status: DC

## 2011-11-05 NOTE — Progress Notes (Signed)
  Subjective:    Patient ID: Paul [name], male    DOB: 10-14-1960, 51 y.o.   MRN: [id number]  HPI  Here with concerns for elevated blood pressure. Recently at orthopedist office with a reading of 160/92. He had occasional headaches and occasional flush like symptoms. No diaphoresis. Monitoring at home mostly 130-140 systolic. Denies any chest pains. No dizziness. Never treated for hypertension. Does have positive family history. Weight relatively stable. No consistent exercise. No alcohol use. Nonsmoker.   Review of Systems  Constitutional: Negative for fatigue.  Eyes: Negative for visual disturbance.  Respiratory: Negative for cough, chest tightness and shortness of breath.   Cardiovascular: Negative for chest pain, palpitations and leg swelling.  Neurological: Positive for headaches. Negative for dizziness, syncope, weakness and light-headedness.       Objective:   Physical Exam  Constitutional: He appears well-developed and well-nourished.  Eyes: Pupils are equal, round, and reactive to light.  Neck: Neck supple. No thyromegaly present.  Cardiovascular: Normal rate and regular rhythm.   Pulmonary/Chest: Effort normal and breath sounds normal. No respiratory distress. He has no wheezes. He has no rales.  Musculoskeletal: He exhibits no edema.          Assessment & Plan:  Hypertension. Currently untreated. Recommended weight loss and more consistent exercise. Sodium reduction. Start lisinopril 10 mg daily and reassess blood pressure one month

## 2011-11-05 NOTE — Telephone Encounter (Signed)
Pt wanted to be sure that his script for fluticasone (FLONASE) 50 MCG/ACT nasal spray be called in to [facility] today.

## 2011-11-05 NOTE — Patient Instructions (Signed)

## 2011-11-07 DIAGNOSIS — I10 Essential (primary) hypertension: Secondary | ICD-10-CM | POA: Insufficient documentation

## 2011-11-10 ENCOUNTER — Other Ambulatory Visit: Payer: Self-pay | Admitting: *Deleted

## 2011-11-10 ENCOUNTER — Encounter: Payer: Self-pay | Admitting: Vascular Surgery

## 2011-11-10 DIAGNOSIS — I739 Peripheral vascular disease, unspecified: Secondary | ICD-10-CM

## 2011-12-06 ENCOUNTER — Ambulatory Visit (INDEPENDENT_AMBULATORY_CARE_PROVIDER_SITE_OTHER): Payer: 59 | Admitting: Family Medicine

## 2011-12-06 ENCOUNTER — Encounter: Payer: Self-pay | Admitting: Family Medicine

## 2011-12-06 VITALS — BP 124/78 | Temp 97.8°F | Wt 290.0 lb

## 2011-12-06 DIAGNOSIS — I10 Essential (primary) hypertension: Secondary | ICD-10-CM

## 2011-12-06 NOTE — Patient Instructions (Signed)

## 2011-12-06 NOTE — Progress Notes (Signed)
  Subjective:    Patient ID: Paul [name], male    DOB: 30-Apr-1961, 51 y.o.   MRN: [id number]  HPI  Followup hypertension. Patient feels much better since starting lisinopril. Fewer headaches. Denies any cough or other side effects. No dizziness. Compliant with therapy. Home blood pressures mostly 130-140 systolic. Overall improved. Exercise limited by chronic low back pain. He is on hydrocodone per neurosurgeon  Past Medical History  Diagnosis Date  . HYPERLIPIDEMIA 11/20/2008  . OBESITY 01/24/2009  . PERIPHERAL VASCULAR DISEASE 01/24/2009  . RHINITIS 04/03/2009  . GERD 11/20/2008  . SHOULDER PAIN 11/26/2009  . Cervicalgia 12/17/2009  . PREDIABETES 06/12/2010   Past Surgical History  Procedure Date  . Back surgery   . Shoulder surgery   . Knee surgery     reports that he has never smoked. He does not have any smokeless tobacco history on file. He reports that he does not drink alcohol or use illicit drugs. family history is not on file. Allergies  Allergen Reactions  . Atorvastatin     REACTION: myalgia  . Augmentin Diarrhea  . Cephalexin Nausea Only  . Oxycodone Hcl Nausea Only      Review of Systems  Constitutional: Negative for fatigue.  Eyes: Negative for visual disturbance.  Respiratory: Negative for cough, chest tightness and shortness of breath.   Cardiovascular: Negative for chest pain, palpitations and leg swelling.  Neurological: Negative for dizziness, syncope, weakness, light-headedness and headaches.       Objective:   Physical Exam  Constitutional: He appears well-developed and well-nourished.  Cardiovascular: Normal rate and regular rhythm.   Pulmonary/Chest: Effort normal and breath sounds normal. No respiratory distress. He has no wheezes. He has no rales.  Musculoskeletal: He exhibits no edema.          Assessment & Plan:  Hypertension. Improved. Continue lisinopril 10 mg daily. Needs to work on weight loss. Followup in 4 months to reassess.  Consider complete physical soon. He is overdue for labs regarding lipids

## 2012-01-21 ENCOUNTER — Telehealth: Payer: Self-pay

## 2012-01-21 NOTE — Telephone Encounter (Signed)
Patient left a message stating that he takes Lisinopril for his BP and works outside in the sun all day. Patient states since it has been getting warm he has been experiencing dizziness and light headed. Pt also stated he is drinking plenty of fluids. Pt stated [facility] and was told to "watch being in the sun". Patient would like to know if MD has any suggestions for him? Please advise?

## 2012-01-23 NOTE — Telephone Encounter (Signed)
Let's try switching him to Losartan 50 mg daily and office follow up within 3 months to reassess.  Make sure he stays well hydrated.

## 2012-01-24 MED ORDER — LOSARTAN POTASSIUM 50 MG PO TABS: 50.0000 mg | ORAL_TABLET | Freq: Every day | ORAL | Status: DC

## 2012-01-24 NOTE — Telephone Encounter (Signed)
Pt informed, Rx sent.  Pt will return in 3 months

## 2012-02-29 HISTORY — PX: BACK SURGERY: SHX140

## 2012-03-06 ENCOUNTER — Telehealth: Payer: Self-pay | Admitting: Family Medicine

## 2012-03-06 NOTE — Telephone Encounter (Signed)
Caller: Paul [name]/Patient; PCP: Evelena Peat; CB#: [phone]; ; ; Call regarding High BP; Pt.s BP at the allergy office was 160/100.   Pt. stopped Lisinopril and started the Losarten, as he did better working in the sun, and had less sunburn.  Triaged per Hypertension; Diagnosed or suspected:  All emergent sx r/o per guidelines.  Disposition:  See Provider within 72 hours for:  Multiple elevated BP readings without other sx. and readings exceed expected range defined by tx. plan.  Pt. has appt. for Friday 7/19.  Message sent to Office Via EP[employer].  Care instructions given.

## 2012-03-10 ENCOUNTER — Encounter: Payer: Self-pay | Admitting: Family Medicine

## 2012-03-10 ENCOUNTER — Ambulatory Visit (INDEPENDENT_AMBULATORY_CARE_PROVIDER_SITE_OTHER): Payer: 59 | Admitting: Family Medicine

## 2012-03-10 VITALS — BP 130/90 | Temp 97.7°F | Wt 286.0 lb

## 2012-03-10 DIAGNOSIS — I10 Essential (primary) hypertension: Secondary | ICD-10-CM

## 2012-03-10 NOTE — Patient Instructions (Addendum)
Monitor blood pressure and be in touch if consistently over 140/90. 

## 2012-03-10 NOTE — Progress Notes (Signed)
  Subjective:    Patient ID: Paul [name], male    DOB: 1961/04/02, 50 y.o.   MRN: [id number]  HPI  Patient seen for hypertension followup. We had placed him on lisinopril recently for hypertension and he had some possible photo sensitivity. We switched to losartan and he is tolerating without side effects. No photosensitivity. Was at allergist couple days ago with blood pressure reading 150/100-but had just come in from heat and felt stressed. He's had fairly good control by home readings around 130s over 80s. No headaches. No dizziness. No chest pains. No medication side effects. Compliant with therapy.  He is unfortunately having flareup regarding his chronic low back pain. He's been followed closely by neurosurgeon and is considering repeat surgery   Review of Systems  Constitutional: Negative for fatigue and unexpected weight change.  Eyes: Negative for visual disturbance.  Respiratory: Negative for cough, chest tightness and shortness of breath.   Cardiovascular: Negative for chest pain, palpitations and leg swelling.  Neurological: Negative for dizziness, syncope, weakness, light-headedness and headaches.       Objective:   Physical Exam  Constitutional: He appears well-developed and well-nourished.  Neck: Neck supple. No thyromegaly present.  Cardiovascular: Normal rate and regular rhythm.   Pulmonary/Chest: Effort normal and breath sounds normal. No respiratory distress. He has no wheezes. He has no rales.  Musculoskeletal: He exhibits no edema.          Assessment & Plan:  Hypertension. Marginal control but stable by today's reading. Repeat blood pressure left arm seated after rest 136/80. Continue current dose losartan 50 mg daily. Work on [employer] control. Continue close monitoring at home and prompt followup if consistently over 140/90.

## 2012-03-31 ENCOUNTER — Other Ambulatory Visit (INDEPENDENT_AMBULATORY_CARE_PROVIDER_SITE_OTHER): Payer: 59

## 2012-03-31 DIAGNOSIS — Z0000 Encounter for general adult medical examination without abnormal findings: Secondary | ICD-10-CM

## 2012-03-31 LAB — LIPID PANEL
HDL: 42.6 mg/dL (ref >39.00)
LDL Cholesterol: 66 mg/dL (ref 0–99)
Total CHOL/HDL Ratio: 3
Triglycerides: 79 mg/dL (ref 0.0–149.0)
VLDL: 15.8 mg/dL (ref 0.0–40.0)

## 2012-03-31 LAB — BASIC METABOLIC PANEL
Creatinine, Ser: 0.9 mg/dL (ref 0.4–1.5)
GFR: 95.38 mL/min (ref >60.00)
Glucose, Bld: 112 mg/dL — ABNORMAL HIGH (ref 70–99)
Sodium: 140 mEq/L (ref 135–145)

## 2012-03-31 LAB — CBC WITH DIFFERENTIAL/PLATELET
Basophils Absolute: 0 10*3/uL (ref 0.0–0.1)
Basophils Relative: 0.6 % (ref 0.0–3.0)
Eosinophils Relative: 4.3 % (ref 0.0–5.0)
HCT: 47.4 % (ref 39.0–52.0)
Lymphocytes Relative: 26 % (ref 12.0–46.0)
MCV: 86.7 fl (ref 78.0–100.0)
Neutrophils Relative %: 58.7 % (ref 43.0–77.0)
RBC: 5.46 Mil/uL (ref 4.22–5.81)
RDW: 15.2 % — ABNORMAL HIGH (ref 11.5–14.6)
WBC: 6.6 10*3/uL (ref 4.5–10.5)

## 2012-03-31 LAB — HEPATIC FUNCTION PANEL
Alkaline Phosphatase: 69 U/L (ref 39–117)
Bilirubin, Direct: 0.1 mg/dL (ref 0.0–0.3)
Total Bilirubin: 0.7 mg/dL (ref 0.3–1.2)

## 2012-03-31 LAB — POCT URINALYSIS DIPSTICK
Bilirubin, UA: NEGATIVE
Blood, UA: NEGATIVE
Nitrite, UA: NEGATIVE
Protein, UA: NEGATIVE
Urobilinogen, UA: 0.2
pH, UA: 7.5

## 2012-04-07 ENCOUNTER — Encounter: Payer: Self-pay | Admitting: Family Medicine

## 2012-04-07 ENCOUNTER — Ambulatory Visit (INDEPENDENT_AMBULATORY_CARE_PROVIDER_SITE_OTHER): Payer: 59 | Admitting: Family Medicine

## 2012-04-07 VITALS — BP 138/88 | HR 68 | Temp 97.6°F | Ht 73.25 in | Wt 283.0 lb

## 2012-04-07 DIAGNOSIS — Z0000 Encounter for general adult medical examination without abnormal findings: Secondary | ICD-10-CM

## 2012-04-07 NOTE — Progress Notes (Signed)
Subjective:    Patient ID: Paul [name], male    DOB: 10-Jun-1961, 51 y.o.   MRN: [id number]  HPI  Patient seen for complete physical. He has history of peripheral vascular. disease, hypertension, obesity, chronic back pain, GERD.  He's had some ongoing problems with left lumbar radiculopathy with left lower extremity weakness and atrophy and has planned third back surgery September 9. He remains on Crestor for hyperlipidemia and losartan for hypertension. These have been well controlled. He's recently had some mild weight gain though still down about 20 pounds from couple years ago. Tetanus up-to-date. Colonoscopy 2011. Limited with exercise because his back difficulties  Past Medical History  Diagnosis Date  . HYPERLIPIDEMIA 11/20/2008  . OBESITY 01/24/2009  . PERIPHERAL VASCULAR DISEASE 01/24/2009  . RHINITIS 04/03/2009  . GERD 11/20/2008  . SHOULDER PAIN 11/26/2009  . Cervicalgia 12/17/2009  . PREDIABETES 06/12/2010   Past Surgical History  Procedure Date  . Back surgery   . Shoulder surgery   . Knee surgery     reports that he has never smoked. He does not have any smokeless tobacco history on file. He reports that he does not drink alcohol or use illicit drugs. family history includes Cancer in his paternal grandfather; Heart disease in his maternal uncle; and Hypertension in his father and mother. Allergies  Allergen Reactions  . Amoxicillin-Pot Clavulanate Diarrhea  . Atorvastatin     REACTION: myalgia  . Cephalexin Nausea Only  . Oxycodone Hcl Nausea Only      Review of Systems  Constitutional: Negative for fever, activity change, appetite change, fatigue and unexpected weight change.  HENT: Negative for ear pain, congestion and trouble swallowing.   Eyes: Negative for pain and visual disturbance.  Respiratory: Negative for cough, shortness of breath and wheezing.   Cardiovascular: Negative for chest pain and palpitations.  Gastrointestinal: Negative for nausea, vomiting,  abdominal pain, diarrhea, constipation, blood in stool, abdominal distention and rectal pain.  Genitourinary: Negative for dysuria, hematuria and testicular pain.  Musculoskeletal: Positive for back pain. Negative for joint swelling and arthralgias.  Skin: Negative for rash.  Neurological: Negative for dizziness, syncope and headaches.  Hematological: Negative for adenopathy.  Psychiatric/Behavioral: Negative for confusion and dysphoric mood.       Objective:   Physical Exam  Constitutional: He is oriented to person, place, and time. He appears well-developed and well-nourished. No distress.  HENT:  Head: Normocephalic and atraumatic.  Right Ear: External ear normal.  Left Ear: External ear normal.  Mouth/Throat: Oropharynx is clear and moist.  Eyes: Conjunctivae and EOM are normal. Pupils are equal, round, and reactive to light.  Neck: Normal range of motion. Neck supple. No thyromegaly present.  Cardiovascular: Normal rate, regular rhythm and normal heart sounds.   No murmur heard. Pulmonary/Chest: No respiratory distress. He has no wheezes. He has no rales.  Abdominal: Soft. Bowel sounds are normal. He exhibits no distension and no mass. There is no tenderness. There is no rebound and no guarding.  Genitourinary: Rectum normal and prostate normal.  Musculoskeletal: He exhibits no edema.  Lymphadenopathy:    He has no cervical adenopathy.  Neurological: He is alert and oriented to person, place, and time. He displays normal reflexes. No cranial nerve deficit.  Skin: No rash noted.  Psychiatric: He has a normal mood and affect.          Assessment & Plan:  Complete physical. Labs reviewed with patient and all favorable with exception of prediabetes. His lipids are  improved compared to last check. Continue weight loss efforts. Continue yearly flu vaccine.

## 2012-04-07 NOTE — Patient Instructions (Addendum)
Continue with weight control efforts.

## 2012-04-09 ENCOUNTER — Encounter: Payer: Self-pay | Admitting: Family Medicine

## 2012-04-25 ENCOUNTER — Other Ambulatory Visit: Payer: Self-pay | Admitting: Family Medicine

## 2012-06-03 ENCOUNTER — Other Ambulatory Visit: Payer: Self-pay | Admitting: Family Medicine

## 2012-06-05 ENCOUNTER — Ambulatory Visit (INDEPENDENT_AMBULATORY_CARE_PROVIDER_SITE_OTHER): Payer: 59 | Admitting: Family Medicine

## 2012-06-05 ENCOUNTER — Ambulatory Visit (INDEPENDENT_AMBULATORY_CARE_PROVIDER_SITE_OTHER)
Admission: RE | Admit: 2012-06-05 | Discharge: 2012-06-05 | Disposition: A | Discharge status: Home / Self Care | Payer: 59 | Source: Ambulatory Visit | Attending: Family Medicine | Admitting: Family Medicine

## 2012-06-05 ENCOUNTER — Encounter: Payer: Self-pay | Admitting: Family Medicine

## 2012-06-05 VITALS — BP 144/88 | HR 82 | Temp 98.8°F | Wt 294.0 lb

## 2012-06-05 DIAGNOSIS — M79672 Pain in left foot: Secondary | ICD-10-CM

## 2012-06-05 DIAGNOSIS — M25572 Pain in left ankle and joints of left foot: Secondary | ICD-10-CM

## 2012-06-05 DIAGNOSIS — M79609 Pain in unspecified limb: Secondary | ICD-10-CM

## 2012-06-05 DIAGNOSIS — M25579 Pain in unspecified ankle and joints of unspecified foot: Secondary | ICD-10-CM

## 2012-06-05 MED ORDER — INDOMETHACIN ER 75 MG PO CPCR: 75.0000 mg | ORAL_CAPSULE | Freq: Two times a day (BID) | ORAL | Status: DC

## 2012-06-05 NOTE — Progress Notes (Signed)
  Subjective:    Patient ID: Paul [name], male    DOB: [birth year], 51 y.o.   MRN: [id number]  HPI Here to ask about his left foot. On 02-29-12 he had lumbar spine surgery with removal of bone spurs and a fusion. Part of his symptoms at the time was numbness down the entire left leg and into the foot. The day after his surgery he was up walking with PT using a walker, even though the left leg and foot were still numb. That day he noted swelling and pain around the foot and ankle, and this has persisted ever since. The numbness has worn off slightly. He has not fallen or had trauma to the foot. No pain in the calf. The foot has not been red or hot or warm. He has not prior hx of arthritis other the the spine issues. Using Vicodin and some Advil here and there.    Review of Systems  Constitutional: Negative.   Respiratory: Negative.   Cardiovascular: Negative.   Musculoskeletal: Positive for back pain, joint swelling and arthralgias.       Objective:   Physical Exam  Constitutional: He appears well-developed and well-nourished.       Walking with a walker.   Musculoskeletal:       The entire left foot is mildly swollen. It is not warm or red. Mildly tender over the dorsum of the foot and very tender in both the talo-tibial and the talo-fibular areas. Full ROM. Not tender in the calf. Denna Haggard is negative.           Assessment & Plan:  Pain and swelling in the left foot and ankle suggestive of gout. Try Indomethacin. Sent for Xrays today.

## 2012-06-06 ENCOUNTER — Telehealth: Payer: Self-pay | Admitting: Family Medicine

## 2012-06-06 NOTE — Progress Notes (Signed)
Quick Note:  Pt informed ______ 

## 2012-06-06 NOTE — Telephone Encounter (Signed)
Pt informed foot xrays, normal bones left foot

## 2012-06-06 NOTE — Progress Notes (Signed)
Quick Note:  Pt given results as he called ______

## 2012-06-06 NOTE — Telephone Encounter (Signed)
Caller: Khoi/Patient; Patient Name: Paul [name]; PCP: Evelena Peat [facility]); Best Callback Phone Number: [phone]. Call regarding XR results.   No triage.   PLEASE FOLLOW UP WITH PT IN REGARD TO LEFT FOOT XR RESULTS.  Thank you.

## 2012-09-01 ENCOUNTER — Other Ambulatory Visit: Payer: Self-pay | Admitting: Family Medicine

## 2012-09-01 DIAGNOSIS — B001 Herpesviral vesicular dermatitis: Secondary | ICD-10-CM

## 2012-09-01 MED ORDER — VALACYCLOVIR HCL 1 G PO TABS: ORAL_TABLET | ORAL | Status: DC

## 2012-10-23 ENCOUNTER — Ambulatory Visit (INDEPENDENT_AMBULATORY_CARE_PROVIDER_SITE_OTHER): Payer: 59 | Admitting: Family Medicine

## 2012-10-23 ENCOUNTER — Ambulatory Visit: Payer: 59 | Admitting: Family Medicine

## 2012-10-23 ENCOUNTER — Encounter: Payer: Self-pay | Admitting: Family Medicine

## 2012-10-23 VITALS — BP 148/80 | Temp 98.3°F

## 2012-10-23 DIAGNOSIS — I10 Essential (primary) hypertension: Secondary | ICD-10-CM

## 2012-10-23 MED ORDER — AZITHROMYCIN 250 MG PO TABS: ORAL_TABLET | ORAL | Status: AC

## 2012-10-23 NOTE — Progress Notes (Signed)
  Subjective:    Patient ID: Paul [name], male    DOB: [birth year], 52 y.o.   MRN: [id number]  HPI Patient seen for the following  Progressive worsening sinus congestive symptoms and facial pain. Patient had onset of symptoms about 10 days ago. Started as more typical viral URI-type symptoms. [facility] and [facility] Mucinex and using saline nasal irrigation and nasal steroid. Also taking Advil. Symptoms have progressed. Past couple days thick yellow-green nasal mucus Intermittent headaches. No fever. Occasional dry cough  Other issues elevated blood pressure. Treated with losartan 50 mg daily. [facility] blood [facility] of [facility] 152/82 which is similar to his home readings. No dizziness. No chest pains. Compliant with regular medications. No alcohol use.  Past Medical History  Diagnosis Date  . HYPERLIPIDEMIA 11/20/2008  . OBESITY 01/24/2009  . PERIPHERAL VASCULAR DISEASE 01/24/2009  . RHINITIS 04/03/2009  . GERD 11/20/2008  . SHOULDER PAIN 11/26/2009  . Cervicalgia 12/17/2009  . PREDIABETES 06/12/2010   Past Surgical History  Procedure Laterality Date  . Back surgery  02-29-12    lumbar fusion per Dr. Sharolyn Douglas in [facility]   . Shoulder surgery    . Knee surgery      reports that he has never smoked. He has never used smokeless tobacco. He reports that he does not drink alcohol or use illicit drugs. family history includes Cancer in his paternal grandfather; Heart disease in his maternal uncle; and Hypertension in his father and mother. Allergies  Allergen Reactions  . Amoxicillin-Pot Clavulanate Diarrhea  . Atorvastatin     REACTION: myalgia  . Cephalexin Nausea Only  . Oxycodone Hcl Nausea Only      Review of Systems  Constitutional: Positive for fatigue. Negative for fever and chills.  HENT: Positive for congestion and sinus pressure.   Eyes: Negative for visual disturbance.  Respiratory: Positive for cough. Negative for chest tightness and  shortness of breath.   Cardiovascular: Negative for chest pain, palpitations and leg swelling.  Neurological: Positive for headaches. Negative for dizziness, syncope, weakness and light-headedness.       Objective:   Physical Exam  Constitutional: He appears well-developed and well-nourished.  HENT:  Right Ear: External ear normal.  Left Ear: External ear normal.  Mouth/Throat: Oropharynx is clear and moist.  Neck: Neck supple. No thyromegaly present.  Cardiovascular: Normal rate and regular rhythm.   Pulmonary/Chest: Effort normal and breath sounds normal. No respiratory distress. He has no wheezes. He has no rales.  Lymphadenopathy:    He has no cervical adenopathy.          Assessment & Plan:  #1 acute sinusitis. Zithromax for 5 days. Continue Mucinex and saline nasal irrigation #2 hypertension. Poorly controlled. Titrate losartan 100 mg daily. Reassess blood pressure one month. Consider addition of HCTZ at that time if not well controlled

## 2012-10-23 NOTE — Patient Instructions (Signed)
Increase Losartan 50 mg to two daily.

## 2012-11-06 ENCOUNTER — Encounter: Payer: Self-pay | Admitting: Family Medicine

## 2012-11-06 ENCOUNTER — Ambulatory Visit (INDEPENDENT_AMBULATORY_CARE_PROVIDER_SITE_OTHER): Payer: 59 | Admitting: Family Medicine

## 2012-11-06 VITALS — BP 160/90 | Temp 97.7°F

## 2012-11-06 DIAGNOSIS — I10 Essential (primary) hypertension: Secondary | ICD-10-CM

## 2012-11-06 MED ORDER — LOSARTAN POTASSIUM-HCTZ 100-12.5 MG PO TABS: 1.0000 | ORAL_TABLET | Freq: Every day | ORAL | Status: DC

## 2012-11-06 NOTE — Patient Instructions (Signed)

## 2012-11-06 NOTE — Progress Notes (Signed)
  Subjective:    Patient ID: Paul [name], male    DOB: 1961-03-05, 52 y.o.   MRN: [id number]  HPI Followup hypertension. Last visit we increased his losartan to 100 mg He is not having any headaches, dizziness, or chest pain. Compliant with medications. Stopped by today for blood pressure check and noted 160/90. We discussed addition of HCTZ to his current regimen if not controlled today.  Chronic low back pain and is finishing up some physical therapy. He plans to start walking more consistently.  Past Medical History  Diagnosis Date  . HYPERLIPIDEMIA 11/20/2008  . OBESITY 01/24/2009  . PERIPHERAL VASCULAR DISEASE 01/24/2009  . RHINITIS 04/03/2009  . GERD 11/20/2008  . SHOULDER PAIN 11/26/2009  . Cervicalgia 12/17/2009  . PREDIABETES 06/12/2010   Past Surgical History  Procedure Laterality Date  . Back surgery  02-29-12    lumbar fusion per Dr. Sharolyn Douglas in [facility]   . Shoulder surgery    . Knee surgery      reports that he has never smoked. He has never used smokeless tobacco. He reports that he does not drink alcohol or use illicit drugs. family history includes Cancer in his paternal grandfather; Heart disease in his maternal uncle; and Hypertension in his father and mother. Allergies  Allergen Reactions  . Amoxicillin-Pot Clavulanate Diarrhea  . Atorvastatin     REACTION: myalgia  . Cephalexin Nausea Only  . Oxycodone Hcl Nausea Only      Review of Systems  Constitutional: Negative for fatigue.  Eyes: Negative for visual disturbance.  Respiratory: Negative for cough, chest tightness and shortness of breath.   Cardiovascular: Negative for chest pain, palpitations and leg swelling.  Neurological: Negative for dizziness, syncope, weakness, light-headedness and headaches.       Objective:   Physical Exam  Constitutional: He appears well-developed and well-nourished.  Neck: Neck supple. No thyromegaly present.  Cardiovascular: Normal rate and regular rhythm.    Pulmonary/Chest: Effort normal and breath sounds normal. No respiratory distress. He has no wheezes. He has no rales.  Musculoskeletal: He exhibits no edema.          Assessment & Plan:  Hypertension. Poorly controlled. Change losartan to losartan HCTZ 100/12.5 one daily. Reassess blood pressure one month. Try to lose some weight

## 2012-12-07 ENCOUNTER — Ambulatory Visit (INDEPENDENT_AMBULATORY_CARE_PROVIDER_SITE_OTHER): Payer: 59 | Admitting: Family Medicine

## 2012-12-07 ENCOUNTER — Encounter: Payer: Self-pay | Admitting: Family Medicine

## 2012-12-07 VITALS — BP 148/90 | Temp 97.6°F | Wt 298.0 lb

## 2012-12-07 DIAGNOSIS — I10 Essential (primary) hypertension: Secondary | ICD-10-CM

## 2012-12-07 NOTE — Progress Notes (Signed)
  Subjective:    Patient ID: Paul [name], male    DOB: Dec 15, 1960, 52 y.o.   MRN: [id number]  HPI Followup hypertension. Blood pressures been poorly controlled. Last visit we changed to losartan HCTZ. Blood pressures at home ranging 135- 150 systolic over 79-88 diastolic He has gained significant body weight over the past year related to some chronic back difficulties. Back is feeling better now and he plans to start more consistent exercise soon.  Blood pressure has improved since change of medication one month ago. No dizziness. No chest pains. Compliant with therapy. Watch his sodium intake closely  Past Medical History  Diagnosis Date  . HYPERLIPIDEMIA 11/20/2008  . OBESITY 01/24/2009  . PERIPHERAL VASCULAR DISEASE 01/24/2009  . RHINITIS 04/03/2009  . GERD 11/20/2008  . SHOULDER PAIN 11/26/2009  . Cervicalgia 12/17/2009  . PREDIABETES 06/12/2010   Past Surgical History  Procedure Laterality Date  . Back surgery  02-29-12    lumbar fusion per Dr. Sharolyn Douglas in [facility]   . Shoulder surgery    . Knee surgery      reports that he has never smoked. He has never used smokeless tobacco. He reports that he does not drink alcohol or use illicit drugs. family history includes Cancer in his paternal grandfather; Heart disease in his maternal uncle; and Hypertension in his father and mother. Allergies  Allergen Reactions  . Amoxicillin-Pot Clavulanate Diarrhea  . Atorvastatin     REACTION: myalgia  . Cephalexin Nausea Only  . Oxycodone Hcl Nausea Only      Review of Systems  Constitutional: Negative for fatigue.  Eyes: Negative for visual disturbance.  Respiratory: Negative for cough, chest tightness and shortness of breath.   Cardiovascular: Negative for chest pain, palpitations and leg swelling.  Neurological: Negative for dizziness, syncope, weakness, light-headedness and headaches.       Objective:   Physical Exam  Constitutional: He appears well-developed and well-nourished.  No distress.  Neck: Neck supple. No thyromegaly present.  Cardiovascular: Normal rate and regular rhythm.  Exam reveals no gallop.   No murmur heard. Pulmonary/Chest: Effort normal and breath sounds normal. No respiratory distress. He has no wheezes. He has no rales.  Musculoskeletal: He exhibits no edema.          Assessment & Plan:  Hypertension. Improved. Still suboptimal control. Reviewed options. Patient prefers weight loss and reassess 3 months versus additional medication at this time.

## 2012-12-07 NOTE — Patient Instructions (Signed)
Try to establish some regular exercise. Lose some weight Let's plan follow up in 3 months and repeat BP then

## 2013-01-03 ENCOUNTER — Other Ambulatory Visit: Payer: Self-pay

## 2013-03-08 ENCOUNTER — Ambulatory Visit (INDEPENDENT_AMBULATORY_CARE_PROVIDER_SITE_OTHER): Payer: 59 | Admitting: Family Medicine

## 2013-03-08 ENCOUNTER — Encounter: Payer: Self-pay | Admitting: Family Medicine

## 2013-03-08 VITALS — BP 142/88 | HR 88 | Temp 98.6°F | Wt 293.0 lb

## 2013-03-08 DIAGNOSIS — I10 Essential (primary) hypertension: Secondary | ICD-10-CM

## 2013-03-08 MED ORDER — LORAZEPAM 0.5 MG PO TABS: 0.5000 mg | ORAL_TABLET | Freq: Three times a day (TID) | ORAL | Status: DC | PRN

## 2013-03-08 NOTE — Progress Notes (Signed)
  Subjective:    Patient ID: Paul [name], male    DOB: 07-13-1961, 52 y.o.   MRN: [id number]  HPI Followup hypertension Patient's lost a few more pounds which had been his goal in managing his blood pressure. Elevation of 148/90 last visit. Patient states his weight got down around 280 pounds but has gained some back. Blood pressure fairly consistently controlled by home readings. Most readings are reviewed around 130/80.  Generally feels well. He has ongoing chronic back pain has had multiple previous surgeries. Remains on losartan HCTZ. No chest pains. No peripheral edema issues.  Past Medical History  Diagnosis Date  . HYPERLIPIDEMIA 11/20/2008  . OBESITY 01/24/2009  . PERIPHERAL VASCULAR DISEASE 01/24/2009  . RHINITIS 04/03/2009  . GERD 11/20/2008  . SHOULDER PAIN 11/26/2009  . Cervicalgia 12/17/2009  . PREDIABETES 06/12/2010   Past Surgical History  Procedure Laterality Date  . Back surgery  02-29-12    lumbar fusion per Dr. Sharolyn Douglas in [facility]   . Shoulder surgery    . Knee surgery      reports that he has never smoked. He has never used smokeless tobacco. He reports that he does not drink alcohol or use illicit drugs. family history includes Cancer in his paternal grandfather; Heart disease in his maternal uncle; and Hypertension in his father and mother. Allergies  Allergen Reactions  . Amoxicillin-Pot Clavulanate Diarrhea  . Atorvastatin     REACTION: myalgia  . Cephalexin Nausea Only  . Oxycodone Hcl Nausea Only      Review of Systems  Constitutional: Negative for fatigue.  Eyes: Negative for visual disturbance.  Respiratory: Negative for cough, chest tightness and shortness of breath.   Cardiovascular: Negative for chest pain, palpitations and leg swelling.  Musculoskeletal: Positive for back pain.  Neurological: Negative for dizziness, syncope, weakness, light-headedness and headaches.       Objective:   Physical Exam  Constitutional: He appears well-developed  and well-nourished.  Neck: Neck supple. No thyromegaly present.  Cardiovascular: Normal rate and regular rhythm.   Pulmonary/Chest: Effort normal and breath sounds normal. No respiratory distress. He has no wheezes. He has no rales.  Musculoskeletal: He exhibits no edema.          Assessment & Plan:  Hypertension. Improved. Continue weight loss efforts. Continue close monitoring. Routine followup 6 months.

## 2013-05-02 ENCOUNTER — Ambulatory Visit (INDEPENDENT_AMBULATORY_CARE_PROVIDER_SITE_OTHER): Payer: 59 | Admitting: Family Medicine

## 2013-05-02 ENCOUNTER — Encounter: Payer: Self-pay | Admitting: Family Medicine

## 2013-05-02 VITALS — BP 130/74 | HR 87 | Temp 98.1°F | Wt 296.0 lb

## 2013-05-02 DIAGNOSIS — B001 Herpesviral vesicular dermatitis: Secondary | ICD-10-CM

## 2013-05-02 DIAGNOSIS — B009 Herpesviral infection, unspecified: Secondary | ICD-10-CM

## 2013-05-02 DIAGNOSIS — Z23 Encounter for immunization: Secondary | ICD-10-CM

## 2013-05-02 MED ORDER — VALACYCLOVIR HCL 1 G PO TABS: ORAL_TABLET | ORAL | Status: DC

## 2013-05-02 NOTE — Progress Notes (Signed)
  Subjective:    Patient ID: Paul [name], male    DOB: Jan 11, 1961, 52 y.o.   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 One hit him across the mouth. Current blisters are somewhat painful. Recent cold sores involving lower lip He does have prior history of cold sores. Has previously used Valtrex which seemed to help.  Past Medical History  Diagnosis Date  . HYPERLIPIDEMIA 11/20/2008  . OBESITY 01/24/2009  . PERIPHERAL VASCULAR DISEASE 01/24/2009  . RHINITIS 04/03/2009  . GERD 11/20/2008  . SHOULDER PAIN 11/26/2009  . Cervicalgia 12/17/2009  . PREDIABETES 06/12/2010   Past Surgical History  Procedure Laterality Date  . Back surgery  02-29-12    lumbar fusion per Dr. Sharolyn Douglas in [facility]   . Shoulder surgery    . Knee surgery      reports that he has never smoked. He has never used smokeless tobacco. He reports that he does not drink alcohol or use illicit drugs. family history includes Cancer in his paternal grandfather; Heart disease in his maternal uncle; Hypertension in his father and mother. Allergies  Allergen Reactions  . Amoxicillin-Pot Clavulanate Diarrhea  . Atorvastatin     REACTION: myalgia  . Cephalexin Nausea Only  . Oxycodone Hcl Nausea Only        Review of Systems  Constitutional: Negative for fever and chills.       Objective:   Physical Exam  Constitutional: He appears well-developed and well-nourished.  Cardiovascular: Normal rate and regular rhythm.   Pulmonary/Chest: Effort normal and breath sounds normal. No respiratory distress. He has no wheezes. He has no rales.  Skin:  Patient has a cluster of small vesicles left upper lip consistent with cold sore. No other facial rash          Assessment & Plan:  Cold sores left upper lip. Valtrex 1 g 2 at onset of cold sores and two 12 hours later. Sun protection.

## 2013-05-02 NOTE — Patient Instructions (Addendum)
Cold Sore  A cold sore (fever blister) is a skin infection caused by the herpes simplex virus (HSV-1). HSV-1 is closely related to the virus that causes gential herpes (HSV-2), but they are not the same even though both viruses can cause oral and genital infections. Cold sores are small, fluid-filled sores inside of the mouth or on the lips, gums, nose, chin, cheeks, or fingers.   The herpes simplex virus can be easily passed (contagious) to other people through close personal contact, such as kissing or sharing personal items. The virus can also spread to other parts of the body, such as the eyes or genitals. Cold sores are contagious until the sores crust over completely. They often heal within 2 weeks.   Once a person is infected, the herpes simplex virus remains permanently in the body. Therefore, there is no cure for cold sores, and they often recur when a person is tired, stressed, sick, or gets too much sun. Additional factors that can cause a recurrence include hormone changes in menstruation or pregnancy, certain drugs, and cold weather.   CAUSES   Cold sores are caused by the herpes simplex virus. The virus is spread from person to person through close contact, such as through kissing, touching the affected area, or sharing personal items such as lip balm, razors, or eating utensils.   SYMPTOMS   The first infection may not cause symptoms. If symptoms develop, the symptoms often go through different stages. Here is how a cold sore develops:   · Tingling, itching, or burning is felt 1 2 days before the outbreak.    · Fluid-filled blisters appear on the lips, inside the mouth, nose, or on the cheeks.    · The blisters start to ooze clear fluid.    · The blisters dry up and a yellow crust appears in its place.    · The crust falls off.    Symptoms depend on whether it is the initial outbreak or a recurrence. Some other symptoms with the first outbreak may include:   · Fever.    · Sore throat.    · Headache.     · Muscle aches.    · Swollen neck glands.    DIAGNOSIS   A diagnosis is often made based on your symptoms and looking at the sores. Sometimes, a sore may be swabbed and then examined in the lab to make a final diagnosis. If the sores are not present, blood tests can find the herpes simplex virus.   TREATMENT   There is no cure for cold sores and no vaccine for the herpes simplex virus. Within 2 weeks, most cold sores go away on their own without treatment. Medicines cannot make the infection go away, but medicine can help relieve some of the pain associated with the sores, can work to stop the virus from multiplying, and can also shorten healing time. Medicine may be in the form of creams, gels, pills, or a shot.   HOME CARE INSTRUCTIONS   · Only take over-the-counter or prescription medicines for pain, discomfort, or fever as directed by your caregiver. Do not use aspirin.    · Use a cotton-tip swab to apply creams or gels to your sores.    · Do not touch the sores or pick the scabs. Wash your hands often. Do not touch your eyes without washing your hands first.    · Avoid kissing, oral sex, and sharing personal items until sores heal.    · Apply an ice pack on your sores for 10 15 minutes to ease any   discomfort.    · Avoid hot, cold, or salty foods because they may hurt your mouth. Eat a soft, bland diet to avoid irritating the sores. Use a straw to drink if you have pain when drinking out of a glass.    · Keep sores clean and dry to prevent an infection of other tissues.    · Avoid the sun and limit stress if these things trigger outbreaks. If sun causes cold sores, apply sunscreen on the lips before being out in the sun.    SEEK MEDICAL CARE IF:   · You have a fever or persistent symptoms for more than 2 3 days.    · You have a fever and your symptoms suddenly get worse.    · You have pus, not clear fluid, coming from the sores.    · You have redness that is spreading.    · You have pain or irritation in your  eye.    · You get sores on your genitals.    · Your sores do not heal within 2 weeks.    · You have a weakened immune system.    · You have frequent recurrences of cold sores.    MAKE SURE YOU:   · Understand these instructions.  · Will watch your condition.  · Will get help right away if you are not doing well or get worse.  Document Released: 08/06/2000 Document Revised: 05/03/2012 Document Reviewed: 12/22/2011  [employer]® Patient Information ©2014 [employer].

## 2013-05-23 ENCOUNTER — Other Ambulatory Visit: Payer: Self-pay | Admitting: Family Medicine

## 2013-07-27 ENCOUNTER — Other Ambulatory Visit: Payer: Self-pay

## 2013-09-26 ENCOUNTER — Encounter: Payer: Self-pay | Admitting: Family Medicine

## 2013-09-26 ENCOUNTER — Ambulatory Visit (INDEPENDENT_AMBULATORY_CARE_PROVIDER_SITE_OTHER): Payer: 59 | Admitting: Family Medicine

## 2013-09-26 VITALS — BP 130/78 | HR 97 | Temp 98.3°F | Wt 297.0 lb

## 2013-09-26 DIAGNOSIS — J069 Acute upper respiratory infection, unspecified: Secondary | ICD-10-CM

## 2013-09-26 DIAGNOSIS — J029 Acute pharyngitis, unspecified: Secondary | ICD-10-CM

## 2013-09-26 DIAGNOSIS — B9789 Other viral agents as the cause of diseases classified elsewhere: Principal | ICD-10-CM

## 2013-09-26 LAB — POCT RAPID STREP A (OFFICE): Rapid Strep A Screen: NEGATIVE

## 2013-09-26 MED ORDER — FLUTICASONE PROPIONATE 50 MCG/ACT NA SUSP: 2.0000 | Freq: Every day | NASAL | Status: DC

## 2013-09-26 NOTE — Progress Notes (Signed)
Pre visit review using our clinic review tool, if applicable. No additional management support is needed unless otherwise documented below in the visit note. 

## 2013-09-26 NOTE — Patient Instructions (Signed)

## 2013-09-26 NOTE — Progress Notes (Signed)
   Subjective:    Patient ID: Paul [name], male    DOB: 1960/12/07, 53 y.o.   MRN: [id number]  Cough Associated symptoms include headaches and a sore throat. Pertinent negatives include no chills or ear pain.   patient seen for acute upper respiratory infection. Onset last week. He had onset cough and subsequently sore throat and bodyaches. Possible low-grade fever over the weekend but none today. His cough has been mostly dry and relatively mild. He's having frequent postnasal drip symptoms and nasal congestion at night has been his major complaint. He avoids decongestants because of history of hypertension. No sick contacts  Past Medical History  Diagnosis Date  . HYPERLIPIDEMIA 11/20/2008  . OBESITY 01/24/2009  . PERIPHERAL VASCULAR DISEASE 01/24/2009  . RHINITIS 04/03/2009  . GERD 11/20/2008  . SHOULDER PAIN 11/26/2009  . Cervicalgia 12/17/2009  . PREDIABETES 06/12/2010   Past Surgical History  Procedure Laterality Date  . Back surgery  02-29-12    lumbar fusion per Dr. Rennis Harding in [facility]   . Shoulder surgery    . Knee surgery      reports that he has never smoked. He has never used smokeless tobacco. He reports that he does not drink alcohol or use illicit drugs. family history includes Cancer in his paternal grandfather; Heart disease in his maternal uncle; Hypertension in his father and mother. Allergies  Allergen Reactions  . Amoxicillin-Pot Clavulanate Diarrhea  . Atorvastatin     REACTION: myalgia  . Cephalexin Nausea Only  . Oxycodone Hcl Nausea Only       Review of Systems  Constitutional: Positive for fatigue. Negative for chills.  HENT: Positive for congestion and sore throat. Negative for ear pain and sinus pressure.   Respiratory: Positive for cough.   Neurological: Positive for headaches.       Objective:   Physical Exam  Constitutional: He appears well-developed and well-nourished.  HENT:  Right Ear: External ear normal.  Left Ear: External ear normal.   Minimal post pharynx erythema.  No exudate  Neck: Neck supple.  Cardiovascular: Normal rate and regular rhythm.   Pulmonary/Chest: Effort normal and breath sounds normal. No respiratory distress. He has no wheezes. He has no rales.  Lymphadenopathy:    He has no cervical adenopathy.          Assessment & Plan:  Viral upper respiratory infection. Flonase nasal 2 sprays per nostril once daily for nasal congestion. Increase hydration. Followup when necessary

## 2013-09-28 ENCOUNTER — Telehealth: Payer: Self-pay | Admitting: Family Medicine

## 2013-09-28 MED ORDER — AZITHROMYCIN 250 MG PO TABS: ORAL_TABLET | ORAL | Status: DC

## 2013-09-28 NOTE — Telephone Encounter (Signed)
Rx sent to pharmacy and pt is aware. 

## 2013-09-28 NOTE — Telephone Encounter (Signed)
Pt was seen on 09/25/13 dr.burchette provided fluticasone (FLONASE) 50 MCG/ACT nasal spray pt states congestion is now very thick, and green coming from throat, right ear stopped up, pt wants to know if he needs to come back any or can dr. Elease Hashimoto send a new rx in to [facility].

## 2013-09-28 NOTE — Telephone Encounter (Signed)
Send  In Z-pack -5 day pack.

## 2013-10-15 ENCOUNTER — Other Ambulatory Visit: Payer: Self-pay | Admitting: Vascular Surgery

## 2013-10-15 DIAGNOSIS — I739 Peripheral vascular disease, unspecified: Secondary | ICD-10-CM

## 2013-10-23 ENCOUNTER — Other Ambulatory Visit: Payer: Self-pay | Admitting: Family Medicine

## 2013-10-26 ENCOUNTER — Encounter: Payer: Self-pay | Admitting: Family

## 2013-10-29 ENCOUNTER — Ambulatory Visit (INDEPENDENT_AMBULATORY_CARE_PROVIDER_SITE_OTHER): Payer: 59 | Admitting: Family

## 2013-10-29 ENCOUNTER — Encounter: Payer: Self-pay | Admitting: Family

## 2013-10-29 ENCOUNTER — Ambulatory Visit (HOSPITAL_COMMUNITY)
Admission: RE | Admit: 2013-10-29 | Discharge: 2013-10-29 | Disposition: A | Discharge status: Home / Self Care | Payer: 59 | Source: Ambulatory Visit | Attending: Family | Admitting: Family

## 2013-10-29 VITALS — BP 132/88 | HR 87 | Ht 73.25 in | Wt 296.8 lb

## 2013-10-29 DIAGNOSIS — M79609 Pain in unspecified limb: Secondary | ICD-10-CM | POA: Insufficient documentation

## 2013-10-29 DIAGNOSIS — I739 Peripheral vascular disease, unspecified: Secondary | ICD-10-CM

## 2013-10-29 NOTE — Addendum Note (Signed)
Addended by: Dorthula Rue L on: 10/29/2013 02:58 PM   Modules accepted: Orders

## 2013-10-29 NOTE — Progress Notes (Signed)
[facility] [place] HISTORY AND PHYSICAL -PAD  History of Present Illness Paul [name] is a 53 y.o. male patient that was last seen by Dr. Edilia Bo in 2012 for left LE mild arterial occlusive disease, and last non invasive vascular lab studies were in 2013. Lumbar spine surgery a few years ago, numbness improving in left leg. He has no more tightness in calves and thighs with walking since he has been walking a great deal, 30-60 minutes 2-3 x/week; also walks his beagles in the woods. He denies non-healing wounds, denies history of MI.  Pt has not had previous intervention of  LE vascular procedure.  The patient reports New Medical or Surgical History: L4-5-S1 , bone spurs surgery, feel much improved since this, walking with little or no pain, numbness in left leg mostly resolved.  Pt Diabetic: No Pt smoker: non-smoker  Pt meds include: Statin :Yes ASA: Yes Other anticoagulants/antiplatelets: no  Past Medical History  Diagnosis Date  . HYPERLIPIDEMIA 11/20/2008  . OBESITY 01/24/2009  . PERIPHERAL VASCULAR DISEASE 01/24/2009  . RHINITIS 04/03/2009  . GERD 11/20/2008  . SHOULDER PAIN 11/26/2009  . Cervicalgia 12/17/2009  . PREDIABETES 06/12/2010  . Cancer     skin    Social History History  Substance Use Topics  . Smoking status: Never Smoker   . Smokeless tobacco: Never Used  . Alcohol Use: No    Family History Family History  Problem Relation Age of Onset  . Hypertension Mother   . Hyperlipidemia Mother   . Hypertension Father   . Heart disease Maternal Uncle   . Cancer Paternal Grandfather     prostate cancer    Past Surgical History  Procedure Laterality Date  . Back surgery  02-29-12    lumbar fusion per Dr. Sharolyn Douglas in [facility]   . Shoulder surgery    . Knee surgery      Allergies  Allergen Reactions  . Amoxicillin-Pot Clavulanate Diarrhea  . Atorvastatin     REACTION: myalgia  . Cephalexin Nausea Only  . Oxycodone Hcl Nausea Only     Current Outpatient Prescriptions  Medication Sig Dispense Refill  . cetirizine (ZYRTEC ALLERGY) 10 MG tablet Take 10 mg by mouth daily.      . fexofenadine (ALLEGRA) 180 MG tablet Take 180 mg by mouth daily.        . fluticasone (FLONASE) 50 MCG/ACT nasal spray Place 1 spray into the nose daily.  16 g  0  . pyridOXINE (VITAMIN B-6) 100 MG tablet Take 100 mg by mouth daily.      . vitamin B-12 (CYANOCOBALAMIN) 1000 MCG tablet Take 1,000 mcg by mouth daily.      Marland Kitchen aspirin 81 MG tablet Take 81 mg by mouth daily.        Marland Kitchen azithromycin (ZITHROMAX) 250 MG tablet Use as instructed  6 each  0  . CRESTOR 10 MG tablet TAKE 1/2 TABLET BY MOUTH EVERY DAY  45 tablet  3  . fluticasone (FLONASE) 50 MCG/ACT nasal spray Place 2 sprays into both nostrils daily.  16 g  6  . gabapentin (NEURONTIN) 300 MG capsule Take 600 mg by mouth 3 (three) times daily.       Marland Kitchen HYDROcodone-acetaminophen (NORCO) 5-325 MG per tablet Take 2 tablets by mouth 2 (two) times daily as needed. Per Dr Sharolyn Douglas      . LORazepam (ATIVAN) 0.5 MG tablet Take 1 tablet (0.5 mg total) by mouth every 8 (eight)  hours as needed for anxiety.  30 tablet  0  . losartan-hydrochlorothiazide (HYZAAR) 100-12.5 MG per tablet TAKE 1 TABLET BY MOUTH DAILY  30 tablet  11  . Multiple Vitamins-Minerals (CENTRUM SILVER) CHEW Chew 2 tablets by mouth daily.       . valACYclovir (VALTREX) 1000 MG tablet Take 2 at onset of cold sore and repeat 2 12 hours later  30 tablet  0   No current facility-administered medications for this visit.    ROS: See HPI for pertinent positives and negatives.   Physical Examination  Filed Vitals:   10/29/13 1327  BP: 132/88  Pulse: 87   Filed Weights   10/29/13 1327  Weight: 296 lb 12.8 oz (134.628 kg)   Body mass index is 38.87 kg/(m^2).   General: A&O x 3, WDWN, obese male. Gait: normal Eyes: PERRLA. Pulmonary: CTAB, without wheezes , rales or rhonchi. Cardiac: regular Rythm , without detected murmur.          Carotid Bruits Left Right   Negative Negative  Aorta is not palpable. Radial pulses: are 3+ palpable and =.                           VASCULAR EXAM: Extremities without ischemic changes  without Gangrene; without open wounds.                                                                                                          LE Pulses LEFT RIGHT       POPLITEAL  not palpable   not palpable       POSTERIOR TIBIAL   palpable   not palpable        DORSALIS PEDIS      ANTERIOR TIBIAL  palpable   palpable    Abdomen: soft, NT, no masses. Skin: no rashes, no ulcers noted. Musculoskeletal: no muscle wasting or atrophy.  Neurologic: A&O X 3; Appropriate Affect ; SENSATION: normal; MOTOR FUNCTION:  moving all extremities equally, motor strength 5/5 throughout. Speech is fluent/normal. CN 2-12 intact.    Non-Invasive Vascular Imaging: DATE: 10/29/2013 ABI: RIGHT 1.32, Waveforms: triphasic;  LEFT 0.87, Waveforms: triphasic PT, biphasic DP Previous (10/29/2011) ABI's: Right : 1.18, Left: 0.80  ASSESSMENT: Paul [name] is a 53 y.o. male who presents with improving mild left leg arterial occlusive disease. His left leg pain and numbness has improved since his L-spine surgery. He does not have DM and has never smoked. He continues his extensive walking which is the probable basis for the improvement in his ABI's.  PLAN:  I discussed in depth with the patient the nature of atherosclerosis, and emphasized the importance of maximal medical management including strict control of blood pressure, blood glucose, and lipid levels, obtaining regular exercise, and continued cessation of smoking.  The patient is aware that without maximal medical management the underlying atherosclerotic disease process will progress, limiting the benefit of any interventions.  Based on the patient's vascular studies and examination, pt will return to clinic in 2  years for ABI's.  The patient was given  information about PAD including signs, symptoms, treatment, what symptoms should prompt the patient to seek immediate medical care, and risk reduction measures to take.  Clemon Chambers, RN, MSN, FNP-C [facility] Phone: [phone]  Clinic MD: Kellie Simmering  10/29/2013 1:44 PM

## 2013-10-30 ENCOUNTER — Telehealth: Payer: Self-pay | Admitting: Family Medicine

## 2013-10-30 ENCOUNTER — Ambulatory Visit: Payer: 59 | Admitting: Neurosurgery

## 2013-10-30 NOTE — Telephone Encounter (Signed)
Pt is calling regarding an appointment with the vein specialist on yesterday, pt states they informed him to take potasium pills and to lower his intake with drinking orange juice. Pt is concerned about taking potassium pills and wanted to get advice from dr. Elease Hashimoto.Marland Kitchen

## 2013-10-31 ENCOUNTER — Other Ambulatory Visit: Payer: Self-pay | Admitting: Family Medicine

## 2013-10-31 DIAGNOSIS — E876 Hypokalemia: Secondary | ICD-10-CM

## 2013-10-31 NOTE — Telephone Encounter (Signed)
Pt informed and lab is ordered.  

## 2013-10-31 NOTE — Telephone Encounter (Signed)
i have no idea why he would have been instructed in that.  Dietary K intake is usually sufficient for patients on HCTZ.  We should check BMP to verify K if there is any doubt.

## 2014-04-09 ENCOUNTER — Telehealth: Payer: Self-pay | Admitting: Family Medicine

## 2014-04-09 MED ORDER — FLUTICASONE PROPIONATE 50 MCG/ACT NA SUSP
2.0000 | Freq: Every day | NASAL | Status: DC
Start: 2014-04-09 — End: 2014-08-05

## 2014-04-09 NOTE — Telephone Encounter (Signed)
[id number] - [place], [place] - [address] is requesting re-fill on fluticasone (FLONASE) 50 MCG/ACT nasal spray

## 2014-04-09 NOTE — Telephone Encounter (Signed)
RX sent to pharmacy  

## 2014-05-01 ENCOUNTER — Encounter: Payer: Self-pay | Admitting: Gastroenterology

## 2014-05-28 ENCOUNTER — Other Ambulatory Visit: Payer: Self-pay | Admitting: Family Medicine

## 2014-06-11 ENCOUNTER — Other Ambulatory Visit: Payer: Self-pay | Admitting: Family Medicine

## 2014-06-11 NOTE — Telephone Encounter (Signed)
Refill once.  Avoid regular use. 

## 2014-06-11 NOTE — Telephone Encounter (Signed)
Last visit 09/26/13 Last refill 03/08/13 #30 0 refill

## 2014-08-05 ENCOUNTER — Other Ambulatory Visit: Payer: Self-pay | Admitting: Family Medicine

## 2014-10-20 ENCOUNTER — Other Ambulatory Visit: Payer: Self-pay | Admitting: Family Medicine

## 2014-11-16 ENCOUNTER — Other Ambulatory Visit: Payer: Self-pay | Admitting: Family Medicine

## 2014-12-01 ENCOUNTER — Other Ambulatory Visit: Payer: Self-pay | Admitting: Family Medicine

## 2014-12-04 ENCOUNTER — Ambulatory Visit (INDEPENDENT_AMBULATORY_CARE_PROVIDER_SITE_OTHER): Payer: 59 | Admitting: Family Medicine

## 2014-12-04 ENCOUNTER — Encounter: Payer: Self-pay | Admitting: Family Medicine

## 2014-12-04 VITALS — BP 130/70 | HR 90 | Temp 98.1°F | Wt 296.0 lb

## 2014-12-04 DIAGNOSIS — R7309 Other abnormal glucose: Secondary | ICD-10-CM | POA: Diagnosis not present

## 2014-12-04 DIAGNOSIS — I10 Essential (primary) hypertension: Secondary | ICD-10-CM | POA: Diagnosis not present

## 2014-12-04 DIAGNOSIS — E785 Hyperlipidemia, unspecified: Secondary | ICD-10-CM

## 2014-12-04 DIAGNOSIS — R7303 Prediabetes: Secondary | ICD-10-CM

## 2014-12-04 LAB — COMPREHENSIVE METABOLIC PANEL
ALBUMIN: 4 g/dL (ref 3.5–5.2)
ALK PHOS: 78 U/L (ref 39–117)
ALT: 27 U/L (ref 0–53)
AST: 18 U/L (ref 0–37)
BUN: 18 mg/dL (ref 6–23)
CO2: 28 mEq/L (ref 19–32)
Calcium: 9 mg/dL (ref 8.4–10.5)
Chloride: 107 mEq/L (ref 96–112)
Creatinine, Ser: 1.02 mg/dL (ref 0.40–1.50)
GFR: 80.67 mL/min (ref >60.00)
GLUCOSE: 87 mg/dL (ref 70–99)
POTASSIUM: 3.8 meq/L (ref 3.5–5.1)
Sodium: 142 mEq/L (ref 135–145)
Total Bilirubin: 0.4 mg/dL (ref 0.2–1.2)
Total Protein: 6.8 g/dL (ref 6.0–8.3)

## 2014-12-04 LAB — LIPID PANEL
CHOL/HDL RATIO: 3
Cholesterol: 105 mg/dL (ref 0–200)
HDL: 33.4 mg/dL — AB (ref >39.00)
LDL Cholesterol: 33 mg/dL (ref 0–99)
NonHDL: 71.6
Triglycerides: 193 mg/dL — ABNORMAL HIGH (ref 0.0–149.0)
VLDL: 38.6 mg/dL (ref 0.0–40.0)

## 2014-12-04 LAB — HEMOGLOBIN A1C: Hgb A1c MFr Bld: 5.9 % (ref 4.6–6.5)

## 2014-12-04 NOTE — Progress Notes (Signed)
   Subjective:    Patient ID: Paul [name], male    DOB: 19-Jan-1961, 54 y.o.   MRN: [id number]  HPI Patient has history of obesity, hypertension, hyperlipidemia, peripheral vascular disease, prediabetes. He retired during the past year. He is not exercising regularly. Poor compliance with diet at times. Hyperlipidemia treated with Crestor. Takes low-dose of 5 mg every day. Hypertension treated with losartan HCTZ. Blood pressure stable. No dizziness or headaches. He has history of prediabetes. No polyuria or polydipsia. Does not monitor blood sugars regularly.  Peripheral vascular disease followed by vein and vascular surgery. Yearly follow-up there. No recent active claudication symptoms.  Past Medical History  Diagnosis Date  . HYPERLIPIDEMIA 11/20/2008  . OBESITY 01/24/2009  . PERIPHERAL VASCULAR DISEASE 01/24/2009  . RHINITIS 04/03/2009  . GERD 11/20/2008  . SHOULDER PAIN 11/26/2009  . Cervicalgia 12/17/2009  . PREDIABETES 06/12/2010  . Cancer     skin   Past Surgical History  Procedure Laterality Date  . Back surgery  02-29-12    lumbar fusion per Dr. Rennis Harding in [facility]   . Shoulder surgery    . Knee surgery      reports that he has never smoked. He has never used smokeless tobacco. He reports that he does not drink alcohol or use illicit drugs. family history includes Cancer in his paternal grandfather; Heart disease in his maternal uncle; Hyperlipidemia in his mother; Hypertension in his father and mother. Allergies  Allergen Reactions  . Amoxicillin-Pot Clavulanate Diarrhea  . Atorvastatin     REACTION: myalgia  . Cephalexin Nausea Only  . Oxycodone Hcl Nausea Only       Review of Systems  Constitutional: Negative for fatigue and unexpected weight change.  Eyes: Negative for visual disturbance.  Respiratory: Negative for cough, chest tightness and shortness of breath.   Cardiovascular: Negative for chest pain, palpitations and leg swelling.  Endocrine: Negative for  polydipsia and polyuria.  Neurological: Negative for dizziness, syncope, weakness, light-headedness and headaches.       Objective:   Physical Exam  Constitutional: He appears well-developed and well-nourished. No distress.  Neck: Neck supple. No JVD present. No thyromegaly present.  Cardiovascular: Normal rate and regular rhythm.  Exam reveals no gallop.   No murmur heard. Pulmonary/Chest: Effort normal and breath sounds normal. No respiratory distress. He has no wheezes. He has no rales.  Musculoskeletal: He exhibits no edema.  He has some mild atrophy left lower extremity compared to right which is chronic          Assessment & Plan:   #1 hypertension. Stable and at goal. Check basic metabolic panel. Strongly advocate weight loss #2 hyperlipidemia. Check lipid panel. Continue Crestor #3 prediabetes history. We discussed strategies for lowering sugar and starch intake and especially lowering refined sugars. Loose some weight. Check hemoglobin A1c.

## 2014-12-04 NOTE — Patient Instructions (Signed)
Potassium Content of Foods  Potassium is a mineral found in many foods and drinks. It helps keep fluids and minerals balanced in your body and affects how steadily your heart beats. Potassium also helps control your blood pressure and keep your muscles and nervous system healthy.  Certain health conditions and medicines may change the balance of potassium in your body. When this happens, you can help balance your level of potassium through the foods that you do or do not eat. Your health care provider or dietitian may recommend an amount of potassium that you should have each day. The following lists of foods provide the amount of potassium (in parentheses) per serving in each item.  HIGH IN POTASSIUM   The following foods and beverages have 200 mg or more of potassium per serving:  · Apricots, 2 raw or 5 dry (200 mg).  · Artichoke, 1 medium (345 mg).  · Avocado, raw,  ¼ each (245 mg).  · Banana, 1 medium (425 mg).  · Beans, lima, or baked beans, canned, ½ cup (280 mg).  · Beans, white, canned, ½ cup (595 mg).  · Beef roast, 3 oz (320 mg).  · Beef, ground, 3 oz (270 mg).  · Beets, raw or cooked, ½ cup (260 mg).  · Bran muffin, 2 oz (300 mg).  · Broccoli, ½ cup (230 mg).  · Brussels sprouts, ½ cup (250 mg).  · Cantaloupe, ½ cup (215 mg).  · Cereal, 100% bran, ½ cup (200-400 mg).  · Cheeseburger, single, fast food, 1 each (225-400 mg).  · Chicken, 3 oz (220 mg).  · Clams, canned, 3 oz (535 mg).  · Crab, 3 oz (225 mg).  · Dates, 5 each (270 mg).  · Dried beans and peas, ½ cup (300-475 mg).  · Figs, dried, 2 each (260 mg).  · Fish: halibut, tuna, cod, snapper, 3 oz (480 mg).  · Fish: salmon, haddock, swordfish, perch, 3 oz (300 mg).  · Fish, tuna, canned 3 oz (200 mg).  · French fries, fast food, 3 oz (470 mg).  · Granola with fruit and nuts, ½ cup (200 mg).  · Grapefruit juice, ½ cup (200 mg).  · Greens, beet, ½ cup (655 mg).  · Honeydew melon, ½ cup (200 mg).  · Kale, raw, 1 cup (300 mg).  · Kiwi, 1 medium (240  mg).  · Kohlrabi, rutabaga, parsnips, ½ cup (280 mg).  · Lentils, ½ cup (365 mg).  · Mango, 1 each (325 mg).  · Milk, chocolate, 1 cup (420 mg).  · Milk: nonfat, low-fat, whole, buttermilk, 1 cup (350-380 mg).  · Molasses, 1 Tbsp (295 mg).  · Mushrooms, ½ cup (280) mg.  · Nectarine, 1 each (275 mg).  · Nuts: almonds, peanuts, hazelnuts, [place], cashew, mixed, 1 oz (200 mg).  · Nuts, pistachios, 1 oz (295 mg).  · Orange, 1 each (240 mg).  · Orange juice, ½ cup (235 mg).  · Papaya, medium, ½ fruit (390 mg).  · Peanut butter, chunky, 2 Tbsp (240 mg).  · Peanut butter, smooth, 2 Tbsp (210 mg).  · Pear, 1 medium (200 mg).  · Pomegranate, 1 whole (400 mg).  · Pomegranate juice, ½ cup (215 mg).  · Pork, 3 oz (350 mg).  · Potato chips, salted, 1 oz (465 mg).  · Potato, baked with skin, 1 medium (925 mg).  · Potatoes, boiled, ½ cup (255 mg).  · Potatoes, mashed, ½ cup (330 mg).  · Prune juice, ½ cup (  370 mg).  · Prunes, 5 each (305 mg).  · Pudding, chocolate, ½ cup (230 mg).  · Pumpkin, canned, ½ cup (250 mg).  · Raisins, seedless, ¼ cup (270 mg).  · Seeds, sunflower or pumpkin, 1 oz (240 mg).  · Soy milk, 1 cup (300 mg).  · Spinach, ½ cup (420 mg).  · Spinach, canned, ½ cup (370 mg).  · Sweet potato, baked with skin, 1 medium (450 mg).  · Swiss chard, ½ cup (480 mg).  · Tomato or vegetable juice, ½ cup (275 mg).  · Tomato sauce or puree, ½ cup (400-550 mg).  · Tomato, raw, 1 medium (290 mg).  · Tomatoes, canned, ½ cup (200-300 mg).  · Turkey, 3 oz (250 mg).  · Wheat germ, 1 oz (250 mg).  · Winter squash, ½ cup (250 mg).  · Yogurt, plain or fruited, 6 oz (260-435 mg).  · Zucchini, ½ cup (220 mg).  MODERATE IN POTASSIUM  The following foods and beverages have 50-200 mg of potassium per serving:  · Apple, 1 each (150 mg).  · Apple juice, ½ cup (150 mg).  · Applesauce, ½ cup (90 mg).  · Apricot nectar, ½ cup (140 mg).  · Asparagus, small spears, ½ cup or 6 spears (155 mg).  · Bagel, cinnamon raisin, 1 each (130 mg).  · Bagel,  egg or plain, 4 in., 1 each (70 mg).  · Beans, green, ½ cup (90 mg).  · Beans, yellow, ½ cup (190 mg).  · Beer, regular, 12 oz (100 mg).  · Beets, canned, ½ cup (125 mg).  · Blackberries, ½ cup (115 mg).  · Blueberries, ½ cup (60 mg).  · Bread, whole wheat, 1 slice (70 mg).  · Broccoli, raw, ½ cup (145 mg).  · Cabbage, ½ cup (150 mg).  · Carrots, cooked or raw, ½ cup (180 mg).  · Cauliflower, raw, ½ cup (150 mg).  · Celery, raw, ½ cup (155 mg).  · Cereal, bran flakes, ½cup (120-150 mg).  · Cheese, cottage, ½ cup (110 mg).  · Cherries, 10 each (150 mg).  · Chocolate, 1½ oz bar (165 mg).  · Coffee, brewed 6 oz (90 mg).  · Corn, ½ cup or 1 ear (195 mg).  · Cucumbers, ½ cup (80 mg).  · Egg, large, 1 each (60 mg).  · Eggplant, ½ cup (60 mg).  · Endive, raw, ½cup (80 mg).  · English muffin, 1 each (65 mg).  · Fish, orange roughy, 3 oz (150 mg).  · Frankfurter, beef or pork, 1 each (75 mg).  · Fruit cocktail, ½ cup (115 mg).  · Grape juice, ½ cup (170 mg).  · Grapefruit, ½ fruit (175 mg).  · Grapes, ½ cup (155 mg).  · Greens: kale, turnip, collard, ½ cup (110-150 mg).  · Ice cream or frozen yogurt, chocolate, ½ cup (175 mg).  · Ice cream or frozen yogurt, vanilla, ½ cup (120-150 mg).  · Lemons, limes, 1 each (80 mg).  · Lettuce, all types, 1 cup (100 mg).  · Mixed vegetables, ½ cup (150 mg).  · Mushrooms, raw, ½ cup (110 mg).  · Nuts: walnuts, pecans, or macadamia, 1 oz (125 mg).  · Oatmeal, ½ cup (80 mg).  · Okra, ½ cup (110 mg).  · Onions, raw, ½ cup (120 mg).  · Peach, 1 each (185 mg).  · Peaches, canned, ½ cup (120 mg).  · Pears, canned, ½ cup (120 mg).  · Peas, green,   frozen, ½ cup (90 mg).  · Peppers, green, ½ cup (130 mg).  · Peppers, red, ½ cup (160 mg).  · Pineapple juice, ½ cup (165 mg).  · Pineapple, fresh or canned, ½ cup (100 mg).  · Plums, 1 each (105 mg).  · Pudding, vanilla, ½ cup (150 mg).  · Raspberries, ½ cup (90 mg).  · Rhubarb, ½ cup (115 mg).  · Rice, wild, ½ cup (80 mg).  · Shrimp, 3 oz (155  mg).  · Spinach, raw, 1 cup (170 mg).  · Strawberries, ½ cup (125 mg).  · Summer squash ½ cup (175-200 mg).  · Swiss chard, raw, 1 cup (135 mg).  · Tangerines, 1 each (140 mg).  · Tea, brewed, 6 oz (65 mg).  · Turnips, ½ cup (140 mg).  · Watermelon, ½ cup (85 mg).  · Wine, red, table, 5 oz (180 mg).  · Wine, white, table, 5 oz (100 mg).  LOW IN POTASSIUM  The following foods and beverages have less than 50 mg of potassium per serving.  · Bread, white, 1 slice (30 mg).  · Carbonated beverages, 12 oz (less than 5 mg).  · Cheese, 1 oz (20-30 mg).  · Cranberries, ½ cup (45 mg).  · Cranberry juice cocktail, ½ cup (20 mg).  · Fats and oils, 1 Tbsp (less than 5 mg).  · Hummus, 1 Tbsp (32 mg).  · Nectar: papaya, mango, or pear, ½ cup (35 mg).  · Rice, white or brown, ½ cup (50 mg).  · Spaghetti or macaroni, ½ cup cooked (30 mg).  · Tortilla, flour or corn, 1 each (50 mg).  · Waffle, 4 in., 1 each (50 mg).  · Water chestnuts, ½ cup (40 mg).  Document Released: 03/23/2005 Document Revised: 08/14/2013 Document Reviewed: 07/06/2013  Exit[employer]® Patient Information ©2015 [employer]. This information is not intended to replace advice given to you by your health care provider. Make sure you discuss any questions you have with your health care provider.

## 2014-12-04 NOTE — Progress Notes (Signed)
Pre visit review using our clinic review tool, if applicable. No additional management support is needed unless otherwise documented below in the visit note. 

## 2014-12-17 ENCOUNTER — Other Ambulatory Visit: Payer: Self-pay | Admitting: Family Medicine

## 2015-01-12 ENCOUNTER — Other Ambulatory Visit: Payer: Self-pay | Admitting: Family Medicine

## 2015-06-02 ENCOUNTER — Other Ambulatory Visit: Payer: Self-pay | Admitting: Family Medicine

## 2015-07-01 ENCOUNTER — Telehealth: Payer: Self-pay | Admitting: Family Medicine

## 2015-07-01 NOTE — Telephone Encounter (Signed)
Pt call to say that as of 08/24/15 his insurance will no longer pay for Crestor. He said he will have to have the generic rosuvastatin.

## 2015-07-01 NOTE — Telephone Encounter (Signed)
Medication sent in for patient. 

## 2015-08-02 ENCOUNTER — Other Ambulatory Visit: Payer: Self-pay | Admitting: Family Medicine

## 2015-10-27 ENCOUNTER — Encounter: Payer: Self-pay | Admitting: Family

## 2015-11-03 ENCOUNTER — Ambulatory Visit (HOSPITAL_COMMUNITY)
Admission: RE | Admit: 2015-11-03 | Discharge: 2015-11-03 | Disposition: A | Discharge status: Home / Self Care | Payer: 59 | Source: Ambulatory Visit | Attending: Family | Admitting: Family

## 2015-11-03 ENCOUNTER — Ambulatory Visit (INDEPENDENT_AMBULATORY_CARE_PROVIDER_SITE_OTHER): Payer: 59 | Admitting: Family

## 2015-11-03 ENCOUNTER — Encounter: Payer: Self-pay | Admitting: Family

## 2015-11-03 VITALS — BP 147/90 | HR 84 | Temp 97.4°F | Resp 16 | Ht 73.0 in | Wt 295.0 lb

## 2015-11-03 DIAGNOSIS — I739 Peripheral vascular disease, unspecified: Secondary | ICD-10-CM | POA: Diagnosis not present

## 2015-11-03 DIAGNOSIS — I779 Disorder of arteries and arterioles, unspecified: Secondary | ICD-10-CM

## 2015-11-03 DIAGNOSIS — R938 Abnormal findings on diagnostic imaging of other specified body structures: Secondary | ICD-10-CM | POA: Diagnosis not present

## 2015-11-03 DIAGNOSIS — E785 Hyperlipidemia, unspecified: Secondary | ICD-10-CM | POA: Insufficient documentation

## 2015-11-03 DIAGNOSIS — R7303 Prediabetes: Secondary | ICD-10-CM | POA: Insufficient documentation

## 2015-11-03 NOTE — Addendum Note (Signed)
Addended by: Mena Goes on: 11/03/2015 04:50 PM   Modules accepted: Orders

## 2015-11-03 NOTE — Progress Notes (Signed)
[facility] [place] HISTORY AND PHYSICAL -PAD  History of Present Illness Paul [name] is a 55 y.o. male patient that was last seen by Dr. Scot Dock in 2012 for left LE mild arterial occlusive disease.  Lumbar spine surgery a few years ago, numbness improving in left leg. He has no more tightness in calves and thighs with walking since he has been walking a great deal, 30-60 minutes 2-3 x/week; also walks his beagles in the woods. He denies non-healing wounds, denies history of MI.  Pt has not had previous LE vascular procedure.  The patient reports New Medical or Surgical History: had a total of 3 lumbar spine surgeries.   He had L4-5-S1, bone spurs surgery, feel much improved since this, walking with little or no pain, numbness in left leg mostly resolved.  Pt Diabetic: No Pt smoker: non-smoker, exposed to his wife's second hand smoke until recently, she quit smoking.   Pt meds include: Statin :Yes ASA: Yes Other anticoagulants/antiplatelets: no     Past Medical History  Diagnosis Date  . HYPERLIPIDEMIA 11/20/2008  . OBESITY 01/24/2009  . PERIPHERAL VASCULAR DISEASE 01/24/2009  . RHINITIS 04/03/2009  . GERD 11/20/2008  . SHOULDER PAIN 11/26/2009  . Cervicalgia 12/17/2009  . PREDIABETES 06/12/2010  . Cancer     skin    Social History Social History  Substance Use Topics  . Smoking status: Never Smoker   . Smokeless tobacco: Never Used  . Alcohol Use: No    Family History Family History  Problem Relation Age of Onset  . Hypertension Mother   . Hyperlipidemia Mother   . Hypertension Father   . Heart disease Maternal Uncle   . Cancer Paternal Grandfather     prostate cancer    Past Surgical History  Procedure Laterality Date  . Back surgery  02-29-12    lumbar fusion per Dr. Rennis Harding in [facility]   . Shoulder surgery    . Knee surgery      Allergies  Allergen Reactions  . Amoxicillin-Pot Clavulanate Diarrhea  . Atorvastatin     REACTION:  myalgia  . Cephalexin Nausea Only  . Oxycodone Hcl Nausea Only    Current Outpatient Prescriptions  Medication Sig Dispense Refill  . aspirin 81 MG tablet Take 81 mg by mouth daily.      . fexofenadine (ALLEGRA) 180 MG tablet Take 180 mg by mouth daily.      . fluticasone (FLONASE) 50 MCG/ACT nasal spray USE 2 SPRAYS INTO EACH NOSTRIL DAILY 16 g 3  . LORazepam (ATIVAN) 0.5 MG tablet TAKE 1 TABLET BY MOUTH EVERY 8 HOURS AS NEEDED FOR ANXIETY 30 tablet 0  . losartan-hydrochlorothiazide (HYZAAR) 100-12.5 MG tablet TAKE 1 TABLET BY MOUTH EVERY DAY 30 tablet 5  . pyridOXINE (VITAMIN B-6) 100 MG tablet Take 100 mg by mouth daily.    . rosuvastatin (CRESTOR) 10 MG tablet Take 10 mg by mouth daily.    . valACYclovir (VALTREX) 1000 MG tablet Take 2 at onset of cold sore and repeat 2 12 hours later 30 tablet 0  . vitamin B-12 (CYANOCOBALAMIN) 1000 MCG tablet Take 1,000 mcg by mouth daily.     No current facility-administered medications for this visit.    ROS: See HPI for pertinent positives and negatives.   Physical Examination  Filed Vitals:   11/03/15 1437 11/03/15 1442 11/03/15 1443  BP: 134/88 146/94 147/90  Pulse: 84 86 84  Temp: 97.4 F (36.3 C)  TempSrc: Oral    Resp: 16    Height: 6\' 1"  (1.854 m)    Weight: 295 lb (133.811 kg)    SpO2: 98%     Body mass index is 38.93 kg/(m^2).   General: A&O x 3, WDWN, obese male. Gait: normal Eyes: PERRLA. Pulmonary: CTAB, without wheezes , rales or rhonchi. Cardiac: regular rythm, no detected murmur.     Carotid Bruits Left Right   Negative Negative  Aorta is not palpable. Radial pulses: are 3+ palpable and =.   VASCULAR EXAM: Extremities without ischemic changes  without Gangrene; without open wounds.      LE Pulses LEFT RIGHT   Not palpable  1+ palpable   POPLITEAL not palpable  not palpable   POSTERIOR TIBIAL  1+palpable  1+ palpable    DORSALIS PEDIS  ANTERIOR TIBIAL not palpable  1+palpable    Abdomen: soft, NT, no palpable masses. Skin: no rashes, no ulcers. Musculoskeletal: no muscle wasting or atrophy. Neurologic: A&O X 3; Appropriate Affect; SENSATION: normal; MOTOR FUNCTION: moving all extremities equally, motor strength 5/5 throughout. Speech is fluent/normal. CN 2-12 intact.             Non-Invasive Vascular Imaging: DATE: 11/03/2015 ABI: RIGHT: 1.2, Waveforms: triphasic, TBI: 0.84;  LEFT: 0.73, Waveforms: biphasic, TBI: 0.54   Non-Invasive Vascular Imaging: DATE: 10/29/2013 ABI: RIGHT 1.32, Waveforms: triphasic; LEFT 0.87, Waveforms: triphasic PT, biphasic DP Previous (10/29/2011) ABI's: Right : 1.18, Left: 0.80    ASSESSMENT: Paul [name] is a 55 y.o. male who no longer has claudication in his calves with walking. His 3 back surgeries have improved his sx's and he is able to walk more. He has never smoked, but has been exposed to his wife's second hand smoke until she quit recently. His atherosclerotic risk factors include pre-diabetes and obesity. ABI's in his right leg have remained normal and stable, in the left leg have diminished from mild arterial occlusive disease to moderate in 2 years.    PLAN:  Graduated walking program discussed and how to perform. Based on the patient's vascular studies and examination, pt will return to clinic in 1 year with ABI's.  I discussed in depth with the patient the nature of atherosclerosis, and emphasized the importance of maximal medical management including strict control of blood pressure, blood glucose, and lipid levels, obtaining regular exercise, and continued cessation of smoking.  The patient is aware that without maximal medical management the underlying atherosclerotic disease process will progress, limiting the  benefit of any interventions.  The patient was given information about PAD including signs, symptoms, treatment, what symptoms should prompt the patient to seek immediate medical care, and risk reduction measures to take.  Clemon Chambers, RN, MSN, FNP-C [facility] Phone: [phone]  Clinic MD: Trula Slade  11/03/2015 2:28 PM

## 2015-11-03 NOTE — Patient Instructions (Signed)
Peripheral Vascular Disease Peripheral vascular disease (PVD) is a disease of the blood vessels that are not part of your heart and brain. A simple term for PVD is poor circulation. In most cases, PVD narrows the blood vessels that carry blood from your heart to the rest of your body. This can result in a decreased supply of blood to your arms, legs, and internal organs, like your stomach or kidneys. However, it most often affects a person's lower legs and feet. There are two types of PVD.  Organic PVD. This is the more common type. It is caused by damage to the structure of blood vessels.  Functional PVD. This is caused by conditions that make blood vessels contract and tighten (spasm). Without treatment, PVD tends to get worse over time. PVD can also lead to acute ischemic limb. This is when an arm or limb suddenly has trouble getting enough blood. This is a medical emergency. CAUSES Each type of PVD has many different causes. The most common cause of PVD is buildup of a fatty material (plaque) inside of your arteries (atherosclerosis). Small amounts of plaque can break off from the walls of the blood vessels and become lodged in a smaller artery. This blocks blood flow and can cause acute ischemic limb. Other common causes of PVD include:  Blood clots that form inside of blood vessels.  Injuries to blood vessels.  Diseases that cause inflammation of blood vessels or cause blood vessel spasms.  Health behaviors and health history that increase your risk of developing PVD. RISK FACTORS  You may have a greater risk of PVD if you:  Have a family history of PVD.  Have certain medical conditions, including:  High cholesterol.  Diabetes.  High blood pressure (hypertension).  Coronary heart disease.  Past problems with blood clots.  Past injury, such as burns or a broken bone. These may have damaged blood vessels in your limbs.  Buerger disease. This is caused by inflamed blood  vessels in your hands and feet.  Some forms of arthritis.  Rare birth defects that affect the arteries in your legs.  Use tobacco.  Do not get enough exercise.  Are obese.  Are age 50 or older. SIGNS AND SYMPTOMS  PVD may cause many different symptoms. Your symptoms depend on what part of your body is not getting enough blood. Some common signs and symptoms include:  Cramps in your lower legs. This may be a symptom of poor leg circulation (claudication).  Pain and weakness in your legs while you are physically active that goes away when you rest (intermittent claudication).  Leg pain when at rest.  Leg numbness, tingling, or weakness.  Coldness in a leg or foot, especially when compared with the other leg.  Skin or hair changes. These can include:  Hair loss.  Shiny skin.  Pale or bluish skin.  Thick toenails.  Inability to get or maintain an erection (erectile dysfunction). People with PVD are more prone to developing ulcers and sores on their toes, feet, or legs. These may take longer than normal to heal. DIAGNOSIS Your health care provider may diagnose PVD from your signs and symptoms. The health care provider will also do a physical exam. You may have tests to find out what is causing your PVD and determine its severity. Tests may include:  Blood pressure recordings from your arms and legs and measurements of the strength of your pulses (pulse volume recordings).  Imaging studies using sound waves to take pictures of   the blood flow through your blood vessels (Doppler ultrasound).  Injecting a dye into your blood vessels before having imaging studies using:  X-rays (angiogram or arteriogram).  Computer-generated X-rays (CT angiogram).  A powerful electromagnetic field and a computer (magnetic resonance angiogram or MRA). TREATMENT Treatment for PVD depends on the cause of your condition and the severity of your symptoms. It also depends on your age. Underlying  causes need to be treated and controlled. These include long-lasting (chronic) conditions, such as diabetes, high cholesterol, and high blood pressure. You may need to first try making lifestyle changes and taking medicines. Surgery may be needed if these do not work. Lifestyle changes may include:  Quitting smoking.  Exercising regularly.  Following a low-fat, low-cholesterol diet. Medicines may include:  Blood thinners to prevent blood clots.  Medicines to improve blood flow.  Medicines to improve your blood cholesterol levels. Surgical procedures may include:  A procedure that uses an inflated balloon to open a blocked artery and improve blood flow (angioplasty).  A procedure to put in a tube (stent) to keep a blocked artery open (stent implant).  Surgery to reroute blood flow around a blocked artery (peripheral bypass surgery).  Surgery to remove dead tissue from an infected wound on the affected limb.  Amputation. This is surgical removal of the affected limb. This may be necessary in cases of acute ischemic limb that are not improved through medical or surgical treatments. HOME CARE INSTRUCTIONS  Take medicines only as directed by your health care provider.  Do not use any tobacco products, including cigarettes, chewing tobacco, or electronic cigarettes. If you need help quitting, ask your health care provider.  Lose weight if you are overweight, and maintain a healthy weight as directed by your health care provider.  Eat a diet that is low in fat and cholesterol. If you need help, ask your health care provider.  Exercise regularly. Ask your health care provider to suggest some good activities for you.  Use compression stockings or other mechanical devices as directed by your health care provider.  Take good care of your feet.  Wear comfortable shoes that fit well.  Check your feet often for any cuts or sores. SEEK MEDICAL CARE IF:  You have cramps in your legs  while walking.  You have leg pain when you are at rest.  You have coldness in a leg or foot.  Your skin changes.  You have erectile dysfunction.  You have cuts or sores on your feet that are not healing. SEEK IMMEDIATE MEDICAL CARE IF:  Your arm or leg turns cold and blue.  Your arms or legs become red, warm, swollen, painful, or numb.  You have chest pain or trouble breathing.  You suddenly have weakness in your face, arm, or leg.  You become very confused or lose the ability to speak.  You suddenly have a very bad headache or lose your vision.   This information is not intended to replace advice given to you by your health care provider. Make sure you discuss any questions you have with your health care provider.   Document Released: 09/16/2004 Document Revised: 08/30/2014 Document Reviewed: 01/17/2014 Elsevier Interactive Patient Education 2016 [employer]  

## 2015-11-05 ENCOUNTER — Other Ambulatory Visit (INDEPENDENT_AMBULATORY_CARE_PROVIDER_SITE_OTHER): Payer: 59

## 2015-11-05 DIAGNOSIS — Z0000 Encounter for general adult medical examination without abnormal findings: Secondary | ICD-10-CM

## 2015-11-05 LAB — LIPID PANEL
CHOL/HDL RATIO: 3
Cholesterol: 119 mg/dL (ref 0–200)
HDL: 39.7 mg/dL (ref >39.00)
LDL Cholesterol: 60 mg/dL (ref 0–99)
NONHDL: 79.66
Triglycerides: 99 mg/dL (ref 0.0–149.0)
VLDL: 19.8 mg/dL (ref 0.0–40.0)

## 2015-11-05 LAB — BASIC METABOLIC PANEL
BUN: 17 mg/dL (ref 6–23)
CALCIUM: 9.4 mg/dL (ref 8.4–10.5)
CHLORIDE: 103 meq/L (ref 96–112)
CO2: 31 meq/L (ref 19–32)
Creatinine, Ser: 0.99 mg/dL (ref 0.40–1.50)
GFR: 83.21 mL/min (ref >60.00)
GLUCOSE: 114 mg/dL — AB (ref 70–99)
Potassium: 4.3 mEq/L (ref 3.5–5.1)
SODIUM: 140 meq/L (ref 135–145)

## 2015-11-05 LAB — CBC WITH DIFFERENTIAL/PLATELET
BASOS PCT: 0.6 % (ref 0.0–3.0)
Basophils Absolute: 0 10*3/uL (ref 0.0–0.1)
EOS PCT: 3.7 % (ref 0.0–5.0)
Eosinophils Absolute: 0.2 10*3/uL (ref 0.0–0.7)
HEMATOCRIT: 49.3 % (ref 39.0–52.0)
Hemoglobin: 16.5 g/dL (ref 13.0–17.0)
LYMPHS ABS: 2 10*3/uL (ref 0.7–4.0)
Lymphocytes Relative: 33.2 % (ref 12.0–46.0)
MCHC: 33.5 g/dL (ref 30.0–36.0)
MCV: 83.9 fl (ref 78.0–100.0)
MONOS PCT: 9.3 % (ref 3.0–12.0)
Monocytes Absolute: 0.6 10*3/uL (ref 0.1–1.0)
NEUTROS ABS: 3.3 10*3/uL (ref 1.4–7.7)
Neutrophils Relative %: 53.2 % (ref 43.0–77.0)
PLATELETS: 173 10*3/uL (ref 150.0–400.0)
RBC: 5.88 Mil/uL — ABNORMAL HIGH (ref 4.22–5.81)
RDW: 14.8 % (ref 11.5–15.5)
WBC: 6.1 10*3/uL (ref 4.0–10.5)

## 2015-11-05 LAB — HEPATIC FUNCTION PANEL
ALK PHOS: 81 U/L (ref 39–117)
ALT: 30 U/L (ref 0–53)
AST: 20 U/L (ref 0–37)
Albumin: 4.3 g/dL (ref 3.5–5.2)
BILIRUBIN DIRECT: 0.2 mg/dL (ref 0.0–0.3)
TOTAL PROTEIN: 6.9 g/dL (ref 6.0–8.3)
Total Bilirubin: 0.6 mg/dL (ref 0.2–1.2)

## 2015-11-05 LAB — PSA: PSA: 2.7 ng/mL (ref 0.10–4.00)

## 2015-11-05 LAB — TSH: TSH: 1.05 u[IU]/mL (ref 0.35–4.50)

## 2015-11-12 ENCOUNTER — Encounter: Payer: Self-pay | Admitting: Family Medicine

## 2015-11-12 ENCOUNTER — Ambulatory Visit (INDEPENDENT_AMBULATORY_CARE_PROVIDER_SITE_OTHER): Payer: 59 | Admitting: Family Medicine

## 2015-11-12 VITALS — BP 150/92 | HR 92 | Temp 97.7°F | Ht 72.25 in | Wt 293.2 lb

## 2015-11-12 DIAGNOSIS — Z0000 Encounter for general adult medical examination without abnormal findings: Secondary | ICD-10-CM | POA: Diagnosis not present

## 2015-11-12 NOTE — Patient Instructions (Signed)
Try to lose some weight Bring back your cuff with some readings in about 1 month

## 2015-11-12 NOTE — Progress Notes (Signed)
Subjective:    Patient ID: Paul [name], male    DOB: 09/18/1960, 55 y.o.   MRN: [id number]  HPI  patient here for complete physical.  He has chronic problems including chronic back pain , GERD, hyperlipidemia, hypertension, obesity, peripheral vascular disease, and prediabetes. Nonsmoker. Followed closely by vascular surgery. Recent arterial Dopplers showed some progression of left lower extremity vascular disease. He does not have any claudication symptoms and started walking more consistently past couple months.   Patient states he is followed by urology once yearly for PSA screening. History of prediabetes. No polyuria or polydipsia.  hypertension treated with losartan HCTZ. Does not monitor blood pressure at home. No headaches or chest pains. Compliant with therapy.  Hyperlipidemia treated with Crestor. Also takes baby aspirin  Past Medical History  Diagnosis Date  . HYPERLIPIDEMIA 11/20/2008  . OBESITY 01/24/2009  . PERIPHERAL VASCULAR DISEASE 01/24/2009  . RHINITIS 04/03/2009  . GERD 11/20/2008  . SHOULDER PAIN 11/26/2009  . Cervicalgia 12/17/2009  . PREDIABETES 06/12/2010  . Cancer [facility])     skin   Past Surgical History  Procedure Laterality Date  . Back surgery  02-29-12    lumbar [facility] Dr. Rennis Harding in [facility]   . Shoulder surgery    . Knee surgery      reports that he has never smoked. He has never used smokeless tobacco. He reports that he does not drink alcohol or use illicit drugs. family history includes Cancer in his paternal grandfather; Heart disease in his maternal uncle; Hyperlipidemia in his mother; Hypertension in his father and mother. Allergies  Allergen Reactions  . Amoxicillin-Pot Clavulanate Diarrhea  . Atorvastatin     REACTION: myalgia  . Cephalexin Nausea Only  . Oxycodone Hcl Nausea Only      Review of Systems  Constitutional: Negative for fever, activity change, appetite change, fatigue and unexpected weight change.  HENT: Negative for  congestion, ear pain and trouble swallowing.   Eyes: Negative for pain and visual disturbance.  Respiratory: Negative for cough, shortness of breath and wheezing.   Cardiovascular: Negative for chest pain and palpitations.  Gastrointestinal: Negative for nausea, vomiting, abdominal pain, diarrhea, constipation, blood in stool, abdominal distention and rectal pain.  Endocrine: Negative for polydipsia and polyuria.  Genitourinary: Negative for dysuria, hematuria and testicular pain.  Musculoskeletal: Positive for back pain and neck pain. Negative for joint swelling and arthralgias.  Skin: Negative for rash.  Neurological: Negative for dizziness, syncope and headaches.  Hematological: Negative for adenopathy.  Psychiatric/Behavioral: Negative for confusion and dysphoric mood.       Objective:   Physical Exam  Constitutional: He is oriented to person, place, and time. He appears well-developed and well-nourished. No distress.  HENT:  Head: Normocephalic and atraumatic.  Right Ear: External ear normal.  Left Ear: External ear normal.  Mouth/Throat: Oropharynx is clear and moist.  Eyes: Conjunctivae and EOM are normal. Pupils are equal, round, and reactive to light.  Neck: Normal range of motion. Neck supple. No thyromegaly present.  Cardiovascular: Normal rate, regular rhythm and normal heart sounds.   No murmur heard. Pulmonary/Chest: No respiratory distress. He has no wheezes. He has no rales.  Abdominal: Soft. Bowel sounds are normal. He exhibits no distension and no mass. There is no tenderness. There is no rebound and no guarding.  Musculoskeletal: He exhibits no edema.  Lymphadenopathy:    He has no cervical adenopathy.  Neurological: He is alert and oriented to person, place, and time.  He displays normal reflexes. No cranial nerve deficit.  Skin: No rash noted.  Psychiatric: He has a normal mood and affect.          Assessment & Plan:   Physical exam. Labs reviewed. Blood  sugar mildly elevated at 114. PSA 2.7 which is up from 1.3  3 years ago. He plans to get follow-up with urologist in a few months.  Lipids are stable. Blood pressure is up today. He'll get home monitor and monitor regularly over the next several weeks and bring back his cuff to compare with ours at follow-up in one month. If not improved at that point consider additional medication

## 2015-11-12 NOTE — Progress Notes (Signed)
Pre visit review using our clinic review tool, if applicable. No additional management support is needed unless otherwise documented below in the visit note. 

## 2015-12-12 ENCOUNTER — Ambulatory Visit (INDEPENDENT_AMBULATORY_CARE_PROVIDER_SITE_OTHER): Payer: 59 | Admitting: Family Medicine

## 2015-12-12 VITALS — BP 130/82 | HR 96 | Temp 98.2°F | Ht 72.25 in | Wt 293.0 lb

## 2015-12-12 DIAGNOSIS — E785 Hyperlipidemia, unspecified: Secondary | ICD-10-CM

## 2015-12-12 MED ORDER — LORAZEPAM 0.5 MG PO TABS: 0.5000 mg | ORAL_TABLET | Freq: Three times a day (TID) | ORAL | Status: DC | PRN

## 2015-12-12 NOTE — Patient Instructions (Signed)
Continue to monitor blood pressure and be in touch if consistently > 140/90 Try to lose some weight.

## 2015-12-12 NOTE — Progress Notes (Signed)
   Subjective:    Patient ID: Paul [name], male    DOB: [birth year], 55 y.o.   MRN: [id number]  HPI Follow-up hypertension. Refer to recent office note. He had several readings recently with readings over [id number] systolic and 90 diastolic. He remains on losartan HCTZ and compliant with therapy. Has been monitoring blood pressure and consistently below [id number] systolic at home and below 90 diastolic. He walks some for exercise. No peripheral edema issues. Tries to watch his sodium intake. No alcohol use.  Past Medical History  Diagnosis Date  . HYPERLIPIDEMIA 11/20/2008  . OBESITY 01/24/2009  . PERIPHERAL VASCULAR DISEASE 01/24/2009  . RHINITIS 04/03/2009  . GERD 11/20/2008  . SHOULDER PAIN 11/26/2009  . Cervicalgia 12/17/2009  . PREDIABETES 06/12/2010  . Cancer [facility])     skin   Past Surgical History  Procedure Laterality Date  . Back surgery  02-29-12    lumbar fusion per Dr. Rennis Harding in [facility]   . Shoulder surgery    . Knee surgery      reports that he has never smoked. He has never used smokeless tobacco. He reports that he does not drink alcohol or use illicit drugs. family history includes Cancer in his paternal grandfather; Heart disease in his maternal uncle; Hyperlipidemia in his mother; Hypertension in his father and mother. Allergies  Allergen Reactions  . Amoxicillin-Pot Clavulanate Diarrhea  . Atorvastatin     REACTION: myalgia  . Cephalexin Nausea Only  . Oxycodone Hcl Nausea Only      Review of Systems  Constitutional: Negative for fatigue.  Eyes: Negative for visual disturbance.  Respiratory: Negative for cough, chest tightness and shortness of breath.   Cardiovascular: Negative for chest pain, palpitations and leg swelling.  Endocrine: Negative for polydipsia and polyuria.  Neurological: Negative for dizziness, syncope, weakness, light-headedness and headaches.       Objective:   Physical Exam  Constitutional: He is oriented to person, place, and time. He appears  well-developed and well-nourished.  HENT:  Right Ear: External ear normal.  Left Ear: External ear normal.  Mouth/Throat: Oropharynx is clear and moist.  Eyes: Pupils are equal, round, and reactive to light.  Neck: Neck supple. No thyromegaly present.  Cardiovascular: Normal rate and regular rhythm.   Pulmonary/Chest: Effort normal and breath sounds normal. No respiratory distress. He has no wheezes. He has no rales.  Musculoskeletal: He exhibits no edema.  Neurological: He is alert and oriented to person, place, and time.          Assessment & Plan:  Hypertension. Improved since last visit. Continue current regimen. He is strongly encouraged to lose some weight and continue regular walking. Be in touch if blood pressure consistently over 140/90  [name] MD  [facility]

## 2015-12-12 NOTE — Progress Notes (Signed)
Pre visit review using our clinic review tool, if applicable. No additional management support is needed unless otherwise documented below in the visit note. 

## 2016-01-09 ENCOUNTER — Encounter: Payer: Self-pay | Admitting: Family Medicine

## 2016-01-09 ENCOUNTER — Ambulatory Visit (INDEPENDENT_AMBULATORY_CARE_PROVIDER_SITE_OTHER): Payer: 59 | Admitting: Family Medicine

## 2016-01-09 ENCOUNTER — Telehealth: Payer: Self-pay | Admitting: *Deleted

## 2016-01-09 VITALS — BP 138/80 | HR 89 | Temp 98.4°F | Ht 72.0 in | Wt 293.0 lb

## 2016-01-09 DIAGNOSIS — K649 Unspecified hemorrhoids: Secondary | ICD-10-CM

## 2016-01-09 MED ORDER — HYDROCORTISONE ACETATE 30 MG RE SUPP: 1.0000 | Freq: Two times a day (BID) | RECTAL | Status: DC

## 2016-01-09 NOTE — Telephone Encounter (Signed)
Paul [name] is aware waiting on md

## 2016-01-09 NOTE — Patient Instructions (Signed)
Hydrocortisone suppositories What is this medicine? HYDROCORTISONE (hye droe KOR ti sone) is a corticosteroid. It is used to decrease swelling, itching, and pain that is caused by minor skin irritations or by hemorrhoids. This medicine may be used for other purposes; ask your health care provider or pharmacist if you have questions. What should I tell my health care provider before I take this medicine? They need to know if you have any of these conditions: -an unusual or allergic reaction to hydrocortisone, corticosteroids, other medicines, foods, dyes, or preservatives -pregnant or trying to get pregnant -breast-feeding How should I use this medicine? This medicine is for rectal use only. Do not take by mouth. Wash your hands before and after use. Take off the foil wrapping. Wet the tip of the suppository with cold tap water to make it easier to use. Lie on your side with your lower leg straightened out and your upper leg bent forward toward your stomach. Lift upper buttock to expose the rectal area. Apply gentle pressure to insert the suppository completely into the rectum, pointed end first. Hold buttocks together for a few seconds. Remain lying down for about 15 minutes to avoid having the suppository come out. Do not use more often than directed. Talk to your pediatrician regarding the use of this medicine in children. Special care may be needed. Overdosage: If you think you have taken too much of this medicine contact a poison control center or emergency room at once. NOTE: This medicine is only for you. Do not share this medicine with others. What if I miss a dose? If you miss a dose, use it as soon as you can. If it is almost time for your next dose, use only that dose. Do not use double or extra doses. What may interact with this medicine? Interactions are not expected. Do not use any other rectal products on the affected area without telling your doctor or health care professional. This  list may not describe all possible interactions. Give your health care provider a list of all the medicines, herbs, non-prescription drugs, or dietary supplements you use. Also tell them if you smoke, drink alcohol, or use illegal drugs. Some items may interact with your medicine. What should I watch for while using this medicine? Visit your doctor or health care professional for regular checks on your progress. Tell your doctor or health care professional if your symptoms do not improve after a few days of use. Do not use if there is blood in your stools. If you get any type of infection while using this medicine, you may need to stop using this medicine until our infections clears up. Ask your doctor or health care professional for advice. What side effects may I notice from receiving this medicine? Side effects that you should report to your doctor or health care professional as soon as possible: -bloody or black, tarry stools -painful, red, pus filled blisters in hair follicles -rectal pain, burning or bleeding after use of medicine Side effects that usually do not require medical attention (report to your doctor or health care professional if they continue or are bothersome): -changes in skin color -dry skin -itching or irritation This list may not describe all possible side effects. Call your doctor for medical advice about side effects. You may report side effects to FDA at 1-800-FDA-1088. Where should I keep my medicine? Keep out of the reach of children. Store at room temperature between 20 and 25 degrees C (68 and 77 degrees F).  Protect from heat and freezing. Throw away any unused medicine after the expiration date. NOTE: This sheet is a summary. It may not cover all possible information. If you have questions about this medicine, talk to your doctor, pharmacist, or health care provider.    2016, Elsevier/Gold Standard. (2007-12-22 16:07:24) Hemorrhoids Hemorrhoids are swollen veins  around the rectum or anus. There are two types of hemorrhoids:   Internal hemorrhoids. These occur in the veins just inside the rectum. They may poke through to the outside and become irritated and painful.  External hemorrhoids. These occur in the veins outside the anus and can be felt as a painful swelling or hard lump near the anus. CAUSES  Pregnancy.   Obesity.   Constipation or diarrhea.   Straining to have a bowel movement.   Sitting for long periods on the toilet.  Heavy lifting or other activity that caused you to strain.  Anal intercourse. SYMPTOMS   Pain.   Anal itching or irritation.   Rectal bleeding.   Fecal leakage.   Anal swelling.   One or more lumps around the anus.  DIAGNOSIS  Your caregiver may be able to diagnose hemorrhoids by visual examination. Other examinations or tests that may be performed include:   Examination of the rectal area with a gloved hand (digital rectal exam).   Examination of anal canal using a small tube (scope).   A blood test if you have lost a significant amount of blood.  A test to look inside the colon (sigmoidoscopy or colonoscopy). TREATMENT Most hemorrhoids can be treated at home. However, if symptoms do not seem to be getting better or if you have a lot of rectal bleeding, your caregiver may perform a procedure to help make the hemorrhoids get smaller or remove them completely. Possible treatments include:   Placing a rubber band at the base of the hemorrhoid to cut off the circulation (rubber band ligation).   Injecting a chemical to shrink the hemorrhoid (sclerotherapy).   Using a tool to burn the hemorrhoid (infrared light therapy).   Surgically removing the hemorrhoid (hemorrhoidectomy).   Stapling the hemorrhoid to block blood flow to the tissue (hemorrhoid stapling).  HOME CARE INSTRUCTIONS   Eat foods with fiber, such as whole grains, beans, nuts, fruits, and vegetables. Ask your doctor  about taking products with added fiber in them (fibersupplements).  Increase fluid intake. Drink enough water and fluids to keep your urine clear or pale yellow.   Exercise regularly.   Go to the bathroom when you have the urge to have a bowel movement. Do not wait.   Avoid straining to have bowel movements.   Keep the anal area dry and clean. Use wet toilet paper or moist towelettes after a bowel movement.   Medicated creams and suppositories may be used or applied as directed.   Only take over-the-counter or prescription medicines as directed by your caregiver.   Take warm sitz baths for 15-20 minutes, 3-4 times a day to ease pain and discomfort.   Place ice packs on the hemorrhoids if they are tender and swollen. Using ice packs between sitz baths may be helpful.   Put ice in a plastic bag.   Place a towel between your skin and the bag.   Leave the ice on for 15-20 minutes, 3-4 times a day.   Do not use a donut-shaped pillow or sit on the toilet for long periods. This increases blood pooling and pain.  SEEK MEDICAL CARE IF:  You have increasing pain and swelling that is not controlled by treatment or medicine.  You have uncontrolled bleeding.  You have difficulty or you are unable to have a bowel movement.  You have pain or inflammation outside the area of the hemorrhoids. MAKE SURE YOU:  Understand these instructions.  Will watch your condition.  Will get help right away if you are not doing well or get worse.   This information is not intended to replace advice given to you by your health care provider. Make sure you discuss any questions you have with your health care provider.   Document Released: 08/06/2000 Document Revised: 07/26/2012 Document Reviewed: 06/13/2012 Elsevier Interactive Patient Education [employer].

## 2016-01-09 NOTE — Telephone Encounter (Signed)
Paul [name] from [facility] in [place] states they received the Rx for Hydrocortisone suppositories 30mg  and they only have 25mg .  Is the 25mg  Ok?  Please call Paul [name] at [phone] as the pt is waiting there now.

## 2016-01-09 NOTE — Telephone Encounter (Signed)
25 mg OK

## 2016-01-09 NOTE — Telephone Encounter (Signed)
I called the pharmacy and informed Juliann Pulse of the message below.

## 2016-01-09 NOTE — Progress Notes (Signed)
   Subjective:    Patient ID: Paul [name], male    DOB: [birth year], 55 y.o.   MRN: [id number]  HPI Patient seen with concern for possible "hemorrhoids " He's noticed over the past week or so some mild irritation. Minimal pain. No bleeding. No change in stools. Denies constipation. Has done lots of lifting recently. Doing sitz baths with mild improvement.  Past Medical History  Diagnosis Date  . HYPERLIPIDEMIA 11/20/2008  . OBESITY 01/24/2009  . PERIPHERAL VASCULAR DISEASE 01/24/2009  . RHINITIS 04/03/2009  . GERD 11/20/2008  . SHOULDER PAIN 11/26/2009  . Cervicalgia 12/17/2009  . PREDIABETES 06/12/2010  . Cancer [facility])     skin   Past Surgical History  Procedure Laterality Date  . Back surgery  02-29-12    lumbar fusion per Dr. Rennis Harding in [facility]   . Shoulder surgery    . Knee surgery      reports that he has never smoked. He has never used smokeless tobacco. He reports that he does not drink alcohol or use illicit drugs. family history includes Cancer in his paternal grandfather; Heart disease in his maternal uncle; Hyperlipidemia in his mother; Hypertension in his father and mother. Allergies  Allergen Reactions  . Amoxicillin-Pot Clavulanate Diarrhea  . Atorvastatin     REACTION: myalgia  . Cephalexin Nausea Only  . Oxycodone Hcl Nausea Only      Review of Systems  Constitutional: Negative for appetite change and unexpected weight change.  Gastrointestinal: Negative for abdominal pain, diarrhea, constipation and anal bleeding.       Objective:   Physical Exam  Constitutional: He appears well-developed and well-nourished.  Cardiovascular: Normal rate and regular rhythm.   Pulmonary/Chest: Effort normal and breath sounds normal. No respiratory distress. He has no wheezes. He has no rales.  Genitourinary:  No visible anal fissure. No external masses. He has some nonspecific mild erythema. He has some internal hemorrhoids but no active bleeding            Assessment & Plan:  Hemorrhoids. We've recommended measures to reduce constipation. Hydrocortisone 30 mg suppository twice daily for 2 weeks. Touch base in 2-3 weeks if no improvement  [name] MD [facility] at [facility]

## 2016-01-09 NOTE — Progress Notes (Signed)
Pre visit review using our clinic review tool, if applicable. No additional management support is needed unless otherwise documented below in the visit note. 

## 2016-01-14 ENCOUNTER — Other Ambulatory Visit: Payer: Self-pay | Admitting: Family Medicine

## 2016-05-02 ENCOUNTER — Other Ambulatory Visit: Payer: Self-pay | Admitting: Family Medicine

## 2016-08-13 ENCOUNTER — Ambulatory Visit: Payer: 59 | Admitting: Family Medicine

## 2016-09-15 ENCOUNTER — Ambulatory Visit (INDEPENDENT_AMBULATORY_CARE_PROVIDER_SITE_OTHER): Payer: 59 | Admitting: Family Medicine

## 2016-09-15 ENCOUNTER — Encounter: Payer: Self-pay | Admitting: Family Medicine

## 2016-09-15 VITALS — BP 150/80 | HR 105 | Temp 101.3°F | Ht 72.0 in | Wt 295.0 lb

## 2016-09-15 DIAGNOSIS — R509 Fever, unspecified: Secondary | ICD-10-CM

## 2016-09-15 DIAGNOSIS — J101 Influenza due to other identified influenza virus with other respiratory manifestations: Secondary | ICD-10-CM

## 2016-09-15 LAB — POC INFLUENZA A&B (BINAX/QUICKVUE): Influenza A, POC: POSITIVE — AB

## 2016-09-15 MED ORDER — OSELTAMIVIR PHOSPHATE 75 MG PO CAPS: 75.0000 mg | ORAL_CAPSULE | Freq: Two times a day (BID) | ORAL | 0 refills | Status: DC

## 2016-09-15 NOTE — Progress Notes (Signed)
Pre visit review using our clinic review tool, if applicable. No additional management support is needed unless otherwise documented below in the visit note. 

## 2016-09-15 NOTE — Patient Instructions (Signed)

## 2016-09-15 NOTE — Progress Notes (Signed)
Subjective:     Patient ID: Paul [name], male   DOB: 1961/06/16, 57 y.o.   MRN: [id number]  HPI Acute visit. Patient seen with fairly acute onset yesterday of fever, body aches, cough, sore throat and increased fatigue and malaise. He denies any nausea or vomiting. Using over-the-counter cough medications.  No diarrhea.  No dyspnea at rest.  Past Medical History:  Diagnosis Date  . Cancer ([place])    skin  . Cervicalgia 12/17/2009  . GERD 11/20/2008  . HYPERLIPIDEMIA 11/20/2008  . OBESITY 01/24/2009  . PERIPHERAL VASCULAR DISEASE 01/24/2009  . PREDIABETES 06/12/2010  . RHINITIS 04/03/2009  . SHOULDER PAIN 11/26/2009   Past Surgical History:  Procedure Laterality Date  . BACK SURGERY  02-29-12   lumbar fusion per Dr. Rennis Harding in [facility]   . KNEE SURGERY    . SHOULDER SURGERY      reports that he has never smoked. He has never used smokeless tobacco. He reports that he does not drink alcohol or use drugs. family history includes Cancer in his paternal grandfather; Heart disease in his maternal uncle; Hyperlipidemia in his mother; Hypertension in his father and mother. Allergies  Allergen Reactions  . Amoxicillin-Pot Clavulanate Diarrhea  . Atorvastatin     REACTION: myalgia  . Cephalexin Nausea Only  . Oxycodone Hcl Nausea Only     Review of Systems  Constitutional: Positive for chills, fatigue and fever.  HENT: Positive for congestion and sore throat.   Respiratory: Positive for cough.   Gastrointestinal: Negative for nausea and vomiting.  Neurological: Positive for headaches.       Objective:   Physical Exam  Constitutional: He appears well-developed and well-nourished.  HENT:  Right Ear: External ear normal.  Left Ear: External ear normal.  Mouth/Throat: Oropharynx is clear and moist.  Neck: Neck supple.  Cardiovascular: Normal rate and regular rhythm.   Pulmonary/Chest: Effort normal and breath sounds normal. No respiratory distress. He has no wheezes. He has no rales.   Lymphadenopathy:    He has no cervical adenopathy.       Assessment:     Influenza. Rapid screen positive    Plan:     -Tamiflu 75 mg twice a day -Stay well-hydrated -Tylenol or Advil as needed for body aches -we advised plenty of rest and follow up for any worsening symptoms.  [name] MD [facility] at [facility]

## 2016-11-03 ENCOUNTER — Ambulatory Visit: Payer: 59 | Admitting: Family

## 2016-11-03 ENCOUNTER — Encounter (HOSPITAL_COMMUNITY): Payer: 59

## 2016-11-12 ENCOUNTER — Encounter: Payer: Self-pay | Admitting: Family Medicine

## 2016-11-12 ENCOUNTER — Ambulatory Visit (INDEPENDENT_AMBULATORY_CARE_PROVIDER_SITE_OTHER): Payer: 59 | Admitting: Family Medicine

## 2016-11-12 ENCOUNTER — Encounter: Payer: Self-pay | Admitting: Family

## 2016-11-12 VITALS — BP 148/76 | HR 92 | Temp 98.2°F | Ht 72.0 in | Wt 291.1 lb

## 2016-11-12 DIAGNOSIS — S86812A Strain of other muscle(s) and tendon(s) at lower leg level, left leg, initial encounter: Secondary | ICD-10-CM

## 2016-11-12 NOTE — Progress Notes (Signed)
Subjective:     Patient ID: Paul [name], male   DOB: 07/30/1961, 56 y.o.   MRN: [id number]  HPI Patient is seen with one month history of pain left posterior leg radiating from somewhere around behind the patella region to proximal calf muscle. He states that he was cleaning out one of his dog lots and was stepping back over a root and felt a sudden pain-proximal calf region. He had no swelling. No locking or giving way of the knee. Pain is somewhat intermittent. He's had no edema. No bruising. He's tried some Advil which helps. He is using a neoprene sleeve on his knee at times.  Has done some icing. No anterior knee pain.  Past Medical History:  Diagnosis Date  . Cancer ([place])    skin  . Cervicalgia 12/17/2009  . GERD 11/20/2008  . HYPERLIPIDEMIA 11/20/2008  . OBESITY 01/24/2009  . PERIPHERAL VASCULAR DISEASE 01/24/2009  . PREDIABETES 06/12/2010  . RHINITIS 04/03/2009  . SHOULDER PAIN 11/26/2009   Past Surgical History:  Procedure Laterality Date  . BACK SURGERY  02-29-12   lumbar fusion per Dr. Rennis Harding in [facility]   . KNEE SURGERY    . SHOULDER SURGERY      reports that he has never smoked. He has never used smokeless tobacco. He reports that he does not drink alcohol or use drugs. family history includes Cancer in his paternal grandfather; Heart disease in his maternal uncle; Hyperlipidemia in his mother; Hypertension in his father and mother. Allergies  Allergen Reactions  . Amoxicillin-Pot Clavulanate Diarrhea  . Atorvastatin     REACTION: myalgia  . Cephalexin Nausea Only  . Oxycodone Hcl Nausea Only     Review of Systems  Neurological: Negative for weakness and numbness.       Objective:   Physical Exam  Constitutional: He appears well-developed and well-nourished.  Musculoskeletal:  Left knee reveals no effusion. No warmth. No ecchymosis. No localized tenderness. No popliteal swelling. Full range of motion. No medial or lateral joint line tenderness. Cruciate ligament  testing is normal. No pain with plantarflexion or dorsiflexion       Assessment:     Left posterior calf pain. Suspect muscular strain    Plan:     -Continue ice after activity and intermittent ibuprofen -Gentle stretches as tolerated -Consider sports medicine referral not improved in 2 weeks  [name] MD [facility] at [facility]

## 2016-11-12 NOTE — Patient Instructions (Signed)
Let me know if pain not improved in 2 weeks We will look at referral to sports medicine specialist if not better by then Can continue with Advil and neoprene knee brace Continue with icing after activities.

## 2016-11-12 NOTE — Progress Notes (Signed)
Pre visit review using our clinic review tool, if applicable. No additional management support is needed unless otherwise documented below in the visit note. 

## 2016-11-24 ENCOUNTER — Ambulatory Visit (INDEPENDENT_AMBULATORY_CARE_PROVIDER_SITE_OTHER): Payer: 59 | Admitting: Physician Assistant

## 2016-11-24 ENCOUNTER — Encounter: Payer: Self-pay | Admitting: Physician Assistant

## 2016-11-24 ENCOUNTER — Ambulatory Visit (HOSPITAL_COMMUNITY)
Admission: RE | Admit: 2016-11-24 | Discharge: 2016-11-24 | Disposition: A | Discharge status: Home / Self Care | Payer: 59 | Source: Ambulatory Visit | Attending: Family | Admitting: Family

## 2016-11-24 VITALS — BP 151/94 | HR 102 | Temp 97.5°F | Resp 16 | Ht 72.0 in | Wt 292.0 lb

## 2016-11-24 DIAGNOSIS — I779 Disorder of arteries and arterioles, unspecified: Secondary | ICD-10-CM | POA: Insufficient documentation

## 2016-11-24 NOTE — Progress Notes (Signed)
HISTORY AND PHYSICAL     CC:  Routine visit blocked artery in left leg Referring Provider:  [name], MD  HPI: This is a 56 y.o. male pt of Dr. Nicole Cella who is being followed for arterial occlusive disease and claudication of the LLE.  He states that he is doing much better.  He says that after his last back surgery, it took him 2 years of therapy to completely heal, but is doing well now.  He states that he has increased his walking.  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  When he and his father go out to run the dogs, his father rides in the golf cart while he walks.  He states that he can walk a good 20-30 minutes without stopping if the land is flat.  He says it is less than that if he is on an incline before his calf tightens up.  He denies any non healing wounds.  He says he does have some numbness of the left foot from the ankle down after his back surgery.  He does not smoke and his wife has quit smoking.  He is on an aspirin daily.  He does take a a statin for cholesterol management.  He is on an ARB for blood pressure control.    Past Medical History:  Diagnosis Date  . Cancer ([place])    skin  . Cervicalgia 12/17/2009  . GERD 11/20/2008  . HYPERLIPIDEMIA 11/20/2008  . OBESITY 01/24/2009  . PERIPHERAL VASCULAR DISEASE 01/24/2009  . PREDIABETES 06/12/2010  . RHINITIS 04/03/2009  . SHOULDER PAIN 11/26/2009    Past Surgical History:  Procedure Laterality Date  . BACK SURGERY  02-29-12   lumbar fusion per Dr. Rennis Harding in [facility]   . KNEE SURGERY    . SHOULDER SURGERY      Allergies  Allergen Reactions  . Amoxicillin-Pot Clavulanate Diarrhea  . Atorvastatin     REACTION: myalgia  . Cephalexin Nausea Only  . Oxycodone Hcl Nausea Only    Current Outpatient Prescriptions  Medication Sig Dispense Refill  . aspirin 81 MG tablet Take 81 mg by mouth daily.      . fexofenadine (ALLEGRA) 180 MG tablet Take 180 mg by mouth daily.      . fluticasone (FLONASE) 50 MCG/ACT nasal  spray USE 2 SPRAYS INTO EACH NOSTRIL DAILY 16 g 3  . LORazepam (ATIVAN) 0.5 MG tablet Take 1 tablet (0.5 mg total) by mouth every 8 (eight) hours as needed. for anxiety 30 tablet 0  . losartan-hydrochlorothiazide (HYZAAR) 100-12.5 MG tablet TAKE 1 TABLET BY MOUTH EVERY DAY 30 tablet 11  . pyridOXINE (VITAMIN B-6) 100 MG tablet Take 100 mg by mouth daily.    . rosuvastatin (CRESTOR) 10 MG tablet Take 10 mg by mouth daily.    . valACYclovir (VALTREX) 1000 MG tablet Take 2 at onset of cold sore and repeat 2 12 hours later 30 tablet 0  . vitamin B-12 (CYANOCOBALAMIN) 1000 MCG tablet Take 1,000 mcg by mouth daily.     No current facility-administered medications for this visit.     Family History  Problem Relation Age of Onset  . Hypertension Mother   . Hyperlipidemia Mother   . Hypertension Father   . Heart disease Maternal Uncle   . Cancer Paternal Grandfather     prostate cancer    Social History   Social History  . Marital status: Married    Spouse name: N/A  . Number  of children: N/A  . Years of education: N/A   Occupational History  . Not on file.   Social History Main Topics  . Smoking status: Never Smoker  . Smokeless tobacco: Never Used  . Alcohol use No  . Drug use: No  . Sexual activity: Yes   Other Topics Concern  . Not on file   Social History Narrative  . No narrative on file     REVIEW OF SYSTEMS:   [X]  denotes positive finding, [ ]  denotes negative finding Cardiac  Comments:  Chest pain or chest pressure:    Shortness of breath upon exertion:    Short of breath when lying flat:    Irregular heart rhythm:        Vascular    Pain in calf, thigh, or hip brought on by ambulation: x See HPI  Pain in feet at night that wakes you up from your sleep:     Blood clot in your veins:    Leg swelling:         Pulmonary    Oxygen at home:    Productive cough:     Wheezing:         Neurologic    Sudden weakness in arms or legs:     Sudden numbness in  arms or legs:     Sudden onset of difficulty speaking or slurred speech:    Temporary loss of vision in one eye:     Problems with dizziness:         Gastrointestinal    Blood in stool:     Vomited blood:         Genitourinary    Burning when urinating:     Blood in urine:        Psychiatric    Major depression:         Hematologic    Bleeding problems:    Problems with blood clotting too easily:        Skin    Rashes or ulcers:        Constitutional    Fever or chills:      PHYSICAL EXAMINATION:  Vitals:   11/24/16 1409  BP: (!) 151/94  Pulse: (!) 102  Resp: 16  Temp: 97.5 F (36.4 C)   Body mass index is 39.6 kg/m.  General:  WDWN in NAD; vital signs documented above Gait: Not observed HENT: WNL, normocephalic Pulmonary: normal non-labored breathing , without Rales, rhonchi,  wheezing Cardiac: regular HR, without  Murmurs, rubs or gallops; without carotid bruits Abdomen: soft, NT, no masses Skin: without rashes Vascular Exam/Pulses:  Right Left  Radial 2+ (normal) 2+ (normal)  Ulnar 2+ (normal) 2+ (normal)  DP 2+ (normal) 2+ (normal)  PT Unable to palpate  Unable to palpate    Extremities: without ischemic changes, without Gangrene , without cellulitis; without open wounds;  Musculoskeletal: no muscle wasting or atrophy  Neurologic: A&O X 3;  No focal weakness or paresthesias are detected Psychiatric:  The pt has Normal affect.   Non-Invasive Vascular Imaging:   ABI's on 11/24/16: Right:  1.19 (T) Left:  0.81(T)  TBI's on 11/24/16: Right:  1.01 Left:  0.69  Previous ABI's on 11/03/16: Right:  0.99 Left:  0.73   TBI's on 11/03/15: Right:  0.84 Left:  0.54  Pt meds includes: Statin:  Yes.   Beta Blocker:  No. Aspirin:  Yes.   ACEI:  No. ARB:  Yes.   CCB use:  No  Other Antiplatelet/Anticoagulant:  No   ASSESSMENT/PLAN:: 56 y.o. male with PAD   -pt has had claudication in the LLE in the past, however, he has increased his walking  daily, which he is benefiting from.  His ABI's have improved and he can now walk 20-30 minutes without any claudication. -I have encouraged him to continue his walking program and staying active -he has palpable DP pulses bilaterally -we discussed diet and exercise as well as good blood pressure control -he understands that if starts to get worsening claudication of non healing wounds, he will contact us, otherwise, we will see him back in a year with repeat ABI's.   Leontine Locket, PA-C [facility] [phone]  Clinic MD:  Early

## 2016-11-25 NOTE — Addendum Note (Signed)
Addended by: Lianne Cure A on: 11/25/2016 09:57 AM   Modules accepted: Orders

## 2016-12-12 ENCOUNTER — Other Ambulatory Visit: Payer: Self-pay | Admitting: Family Medicine

## 2017-02-02 DIAGNOSIS — N400 Enlarged prostate without lower urinary tract symptoms: Secondary | ICD-10-CM | POA: Diagnosis not present

## 2017-02-02 LAB — PSA: PSA: 2.36

## 2017-02-09 DIAGNOSIS — N400 Enlarged prostate without lower urinary tract symptoms: Secondary | ICD-10-CM | POA: Diagnosis not present

## 2017-02-09 DIAGNOSIS — E291 Testicular hypofunction: Secondary | ICD-10-CM | POA: Diagnosis not present

## 2017-02-15 ENCOUNTER — Encounter: Payer: Self-pay | Admitting: Family Medicine

## 2017-04-06 ENCOUNTER — Ambulatory Visit (INDEPENDENT_AMBULATORY_CARE_PROVIDER_SITE_OTHER): Payer: 59 | Admitting: Family Medicine

## 2017-04-06 ENCOUNTER — Encounter: Payer: Self-pay | Admitting: Family Medicine

## 2017-04-06 VITALS — BP 140/70 | HR 78 | Temp 98.5°F | Wt 293.5 lb

## 2017-04-06 DIAGNOSIS — R1310 Dysphagia, unspecified: Secondary | ICD-10-CM

## 2017-04-06 NOTE — Progress Notes (Signed)
Subjective:     Patient ID: Paul [name], male   DOB: 23-Jul-1961, 56 y.o.   MRN: [id number]  HPI   Patient seen with approximately one-month history of some progressive solid food dysphagia. He has had some prior history of reflux and for some time (several years ago) took proton pump inhibitor . He has noted particularly with things like rice and some meats he's had slow transit in his upper esophageal region. No pain with swallowing. Appetite and weight are stable. No abdominal pain. No difficulty swallowing pills.  No vomiting and no odynophagia.  Still has occasional rare GERD symptoms. Has never had upper endoscopy.   Past Medical History:  Diagnosis Date  . Cancer ([place])    skin  . Cervicalgia 12/17/2009  . GERD 11/20/2008  . HYPERLIPIDEMIA 11/20/2008  . OBESITY 01/24/2009  . PERIPHERAL VASCULAR DISEASE 01/24/2009  . PREDIABETES 06/12/2010  . RHINITIS 04/03/2009  . SHOULDER PAIN 11/26/2009   Past Surgical History:  Procedure Laterality Date  . BACK SURGERY  02-29-12   lumbar [facility] Dr. Rennis Harding in [facility]   . KNEE SURGERY    . SHOULDER SURGERY      reports that he has never smoked. He has never used smokeless tobacco. He reports that he does not drink alcohol or use drugs. family history includes Cancer in his paternal grandfather; Heart disease in his maternal uncle; Hyperlipidemia in his mother; Hypertension in his father and mother. Allergies  Allergen Reactions  . Amoxicillin-Pot Clavulanate Diarrhea  . Atorvastatin     REACTION: myalgia  . Cephalexin Nausea Only  . Oxycodone Hcl Nausea Only      Review of Systems  Constitutional: Negative for appetite change and unexpected weight change.  HENT: Positive for trouble swallowing. Negative for sore throat.   Respiratory: Negative for shortness of breath.   Cardiovascular: Negative for chest pain.  Gastrointestinal: Negative for abdominal pain.  Hematological: Negative for adenopathy.       Objective:   Physical  Exam  Constitutional: He appears well-developed and well-nourished.  HENT:  Mouth/Throat: Oropharynx is clear and moist.  Cardiovascular: Normal rate and regular rhythm.   Pulmonary/Chest: Effort normal and breath sounds normal. No respiratory distress. He has no wheezes. He has no rales.  Abdominal: Soft. Bowel sounds are normal. He exhibits no distension and no mass. There is no tenderness. There is no rebound and no guarding.       Assessment:     #1 Solid food dysphagia. He does not have any red flags such as appetite or weight change   #2 intermittent GERD symptoms.    Plan:     -He is encouraged to eat slowly and take small bites and chew food very well  -caution with things like meat.  -Consider getting back on Nexium or Prilosec 1 daily  -Set up GI referral for further evaluation   [name] MD [facility] at [facility]

## 2017-04-06 NOTE — Patient Instructions (Signed)

## 2017-04-19 ENCOUNTER — Other Ambulatory Visit: Payer: Self-pay | Admitting: Family Medicine

## 2017-05-05 ENCOUNTER — Encounter: Payer: Self-pay | Admitting: Gastroenterology

## 2017-05-17 ENCOUNTER — Other Ambulatory Visit: Payer: Self-pay | Admitting: Family Medicine

## 2017-05-18 DIAGNOSIS — L814 Other melanin hyperpigmentation: Secondary | ICD-10-CM | POA: Diagnosis not present

## 2017-05-18 DIAGNOSIS — L821 Other seborrheic keratosis: Secondary | ICD-10-CM | POA: Diagnosis not present

## 2017-05-18 DIAGNOSIS — D229 Melanocytic nevi, unspecified: Secondary | ICD-10-CM | POA: Diagnosis not present

## 2017-05-18 DIAGNOSIS — L570 Actinic keratosis: Secondary | ICD-10-CM | POA: Diagnosis not present

## 2017-05-31 ENCOUNTER — Encounter: Payer: Self-pay | Admitting: Family Medicine

## 2017-05-31 ENCOUNTER — Ambulatory Visit (INDEPENDENT_AMBULATORY_CARE_PROVIDER_SITE_OTHER): Payer: 59 | Admitting: Family Medicine

## 2017-05-31 VITALS — BP 118/80 | HR 75 | Temp 97.9°F | Ht 73.5 in | Wt 293.4 lb

## 2017-05-31 DIAGNOSIS — Z23 Encounter for immunization: Secondary | ICD-10-CM | POA: Diagnosis not present

## 2017-05-31 DIAGNOSIS — Z0000 Encounter for general adult medical examination without abnormal findings: Secondary | ICD-10-CM

## 2017-05-31 LAB — CBC WITH DIFFERENTIAL/PLATELET
BASOS ABS: 0 10*3/uL (ref 0.0–0.1)
Basophils Relative: 0.5 % (ref 0.0–3.0)
EOS ABS: 0.2 10*3/uL (ref 0.0–0.7)
Eosinophils Relative: 4.4 % (ref 0.0–5.0)
HCT: 47.2 % (ref 39.0–52.0)
Hemoglobin: 15.8 g/dL (ref 13.0–17.0)
Lymphocytes Relative: 32.7 % (ref 12.0–46.0)
Lymphs Abs: 1.8 10*3/uL (ref 0.7–4.0)
MCHC: 33.4 g/dL (ref 30.0–36.0)
MCV: 85 fl (ref 78.0–100.0)
MONO ABS: 0.6 10*3/uL (ref 0.1–1.0)
Monocytes Relative: 11.3 % (ref 3.0–12.0)
NEUTROS ABS: 2.8 10*3/uL (ref 1.4–7.7)
Neutrophils Relative %: 51.1 % (ref 43.0–77.0)
PLATELETS: 185 10*3/uL (ref 150.0–400.0)
RBC: 5.56 Mil/uL (ref 4.22–5.81)
RDW: 14.7 % (ref 11.5–15.5)
WBC: 5.5 10*3/uL (ref 4.0–10.5)

## 2017-05-31 LAB — HEPATIC FUNCTION PANEL
ALT: 25 U/L (ref 0–53)
AST: 17 U/L (ref 0–37)
Albumin: 4.2 g/dL (ref 3.5–5.2)
Alkaline Phosphatase: 75 U/L (ref 39–117)
BILIRUBIN DIRECT: 0.1 mg/dL (ref 0.0–0.3)
BILIRUBIN TOTAL: 0.6 mg/dL (ref 0.2–1.2)
Total Protein: 6.7 g/dL (ref 6.0–8.3)

## 2017-05-31 LAB — LIPID PANEL
CHOL/HDL RATIO: 3
Cholesterol: 111 mg/dL (ref 0–200)
HDL: 35.3 mg/dL — ABNORMAL LOW (ref >39.00)
LDL CALC: 55 mg/dL (ref 0–99)
NonHDL: 75.79
Triglycerides: 102 mg/dL (ref 0.0–149.0)
VLDL: 20.4 mg/dL (ref 0.0–40.0)

## 2017-05-31 LAB — BASIC METABOLIC PANEL
BUN: 19 mg/dL (ref 6–23)
CO2: 30 mEq/L (ref 19–32)
CREATININE: 1 mg/dL (ref 0.40–1.50)
Calcium: 9.3 mg/dL (ref 8.4–10.5)
Chloride: 105 mEq/L (ref 96–112)
GFR: 81.79 mL/min (ref >60.00)
Glucose, Bld: 121 mg/dL — ABNORMAL HIGH (ref 70–99)
Potassium: 4.1 mEq/L (ref 3.5–5.1)
Sodium: 142 mEq/L (ref 135–145)

## 2017-05-31 LAB — TSH: TSH: 1.14 u[IU]/mL (ref 0.35–4.50)

## 2017-05-31 MED ORDER — ROSUVASTATIN CALCIUM 10 MG PO TABS: 10.0000 mg | ORAL_TABLET | Freq: Every day | ORAL | 3 refills | Status: DC

## 2017-05-31 NOTE — Patient Instructions (Addendum)
Check on coverage for shingles vaccine (Shingrix) if interested. May also want to check and see if they cover Pneumovax

## 2017-05-31 NOTE — Progress Notes (Signed)
Subjective:     Patient ID: Paul [name], male   DOB: December 17, 1960, 56 y.o.   MRN: [id number]  HPI Patient seen for physical exam. He's had past history of back difficulties with multiple surgeries but currently stable. He has history of hyperlipidemia and hypertension and both are treated. Medications reviewed. Compliant with all. Colonoscopy up-to-date. He did have some recent reflux type symptoms and has taken over-the-counter Nexium 20 mg twice daily and this seems to be helping his symptoms. He has pending follow-up with GI in November. He's not had previous hepatitis C screening. Patient is nonsmoker. No regular alcohol use. Needs flu vaccine. He's getting PSA testing through urology  Past Medical History:  Diagnosis Date  . Cancer ([place])    skin  . Cervicalgia 12/17/2009  . GERD 11/20/2008  . HYPERLIPIDEMIA 11/20/2008  . OBESITY 01/24/2009  . PERIPHERAL VASCULAR DISEASE 01/24/2009  . PREDIABETES 06/12/2010  . RHINITIS 04/03/2009  . SHOULDER PAIN 11/26/2009   Past Surgical History:  Procedure Laterality Date  . BACK SURGERY  02-29-12   lumbar [facility] Dr. Rennis Harding [facility]   . KNEE SURGERY    . SHOULDER SURGERY      reports that he has never smoked. He has never used smokeless tobacco. He reports that he does not drink alcohol or use drugs. family history includes Cancer in his paternal grandfather; Heart disease in his maternal uncle; Hyperlipidemia in his mother; Hypertension in his father and mother. Allergies  Allergen Reactions  . Amoxicillin-Pot Clavulanate Diarrhea  . Atorvastatin     REACTION: myalgia  . Cephalexin Nausea Only  . Oxycodone Hcl Nausea Only     Review of Systems  Constitutional: Negative for activity change, appetite change, fatigue and fever.  HENT: Negative for congestion, ear pain and trouble swallowing.   Eyes: Negative for pain and visual disturbance.  Respiratory: Negative for cough, shortness of breath and wheezing.   Cardiovascular: Negative  for chest pain and palpitations.  Gastrointestinal: Negative for abdominal distention, abdominal pain, blood in stool, constipation, diarrhea, nausea, rectal pain and vomiting.  Endocrine: Negative for polydipsia and polyuria.  Genitourinary: Negative for dysuria, hematuria and testicular pain.  Musculoskeletal: Negative for arthralgias and joint swelling.  Skin: Negative for rash.  Neurological: Negative for dizziness, syncope and headaches.  Hematological: Negative for adenopathy.  Psychiatric/Behavioral: Negative for confusion and dysphoric mood.       Objective:   Physical Exam  Constitutional: He is oriented to person, place, and time. He appears well-developed and well-nourished. No distress.  HENT:  Head: Normocephalic and atraumatic.  Right Ear: External ear normal.  Left Ear: External ear normal.  Mouth/Throat: Oropharynx is clear and moist.  Eyes: Pupils are equal, round, and reactive to light. Conjunctivae and EOM are normal.  Neck: Normal range of motion. Neck supple. No thyromegaly present.  Cardiovascular: Normal rate, regular rhythm and normal heart sounds.   No murmur heard. Pulmonary/Chest: No respiratory distress. He has no wheezes. He has no rales.  Abdominal: Soft. Bowel sounds are normal. He exhibits no distension and no mass. There is no tenderness. There is no rebound and no guarding.  Musculoskeletal: He exhibits no edema.  Lymphadenopathy:    He has no cervical adenopathy.  Neurological: He is alert and oriented to person, place, and time. He displays normal reflexes. No cranial nerve deficit.  Skin: No rash noted.  Psychiatric: He has a normal mood and affect.       Assessment:  Physical exam. Several health maintenance issues addressed as below    Plan:     -Obtain screening lab work. We deferred on PSA testing since he had this through urology back in June and plans to see them yearly because of past family history of prostate cancer -Flu  vaccine given -Check hepatitis C antibody -Patient will check on coverage for shingles vaccine. He also had questions about getting one time Pneumovax prior to 56 and he will check on insurance coverage -He is encouraged to lose some weight -We'll need repeat colonoscopy in a few years  [name] MD [place] [facility] at [facility]

## 2017-06-01 LAB — HEPATITIS C ANTIBODY
HEP C AB: NONREACTIVE
SIGNAL TO CUT-OFF: 0.01 (ref <1.00)

## 2017-06-03 ENCOUNTER — Telehealth: Payer: Self-pay | Admitting: Family Medicine

## 2017-06-03 NOTE — Telephone Encounter (Signed)
Labs mailed

## 2017-06-03 NOTE — Telephone Encounter (Signed)
Pt would like a copy of his labs mailed to his home.

## 2017-06-29 ENCOUNTER — Ambulatory Visit: Payer: 59 | Admitting: Gastroenterology

## 2017-10-10 ENCOUNTER — Other Ambulatory Visit: Payer: Self-pay | Admitting: Family Medicine

## 2017-10-11 ENCOUNTER — Other Ambulatory Visit: Payer: Self-pay | Admitting: *Deleted

## 2017-10-11 MED ORDER — ROSUVASTATIN CALCIUM 10 MG PO TABS: ORAL_TABLET | ORAL | 0 refills | Status: DC

## 2017-10-17 ENCOUNTER — Other Ambulatory Visit: Payer: Self-pay | Admitting: Family Medicine

## 2017-11-04 DIAGNOSIS — L578 Other skin changes due to chronic exposure to nonionizing radiation: Secondary | ICD-10-CM | POA: Diagnosis not present

## 2017-11-04 DIAGNOSIS — D0461 Carcinoma in situ of skin of right upper limb, including shoulder: Secondary | ICD-10-CM | POA: Diagnosis not present

## 2017-11-04 DIAGNOSIS — L570 Actinic keratosis: Secondary | ICD-10-CM | POA: Diagnosis not present

## 2017-11-24 ENCOUNTER — Other Ambulatory Visit: Payer: Self-pay

## 2017-11-24 ENCOUNTER — Ambulatory Visit: Payer: 59 | Admitting: Family

## 2017-11-24 ENCOUNTER — Encounter: Payer: Self-pay | Admitting: Family

## 2017-11-24 ENCOUNTER — Ambulatory Visit (HOSPITAL_COMMUNITY)
Admission: RE | Admit: 2017-11-24 | Discharge: 2017-11-24 | Disposition: A | Discharge status: Home / Self Care | Payer: 59 | Source: Ambulatory Visit | Attending: Vascular Surgery | Admitting: Vascular Surgery

## 2017-11-24 VITALS — BP 144/85 | HR 78 | Temp 98.5°F | Resp 18 | Ht 73.0 in | Wt 296.0 lb

## 2017-11-24 DIAGNOSIS — I779 Disorder of arteries and arterioles, unspecified: Secondary | ICD-10-CM | POA: Diagnosis not present

## 2017-11-24 DIAGNOSIS — R0989 Other specified symptoms and signs involving the circulatory and respiratory systems: Secondary | ICD-10-CM | POA: Insufficient documentation

## 2017-11-24 DIAGNOSIS — R9389 Abnormal findings on diagnostic imaging of other specified body structures: Secondary | ICD-10-CM | POA: Diagnosis not present

## 2017-11-24 NOTE — Patient Instructions (Signed)

## 2017-11-24 NOTE — Progress Notes (Signed)
[facility] [place]   CC: Follow up peripheral artery occlusive disease  History of Present Illness Paul [name] is a 57 y.o. male whom Dr. Nicole Cella has monitored for arterial occlusive disease and claudication of the LLE.   He says that after his last back surgery, it took him 2 years of therapy to completely heal, but is doing well now.  He has increased his walking.  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  When he and his father go out to run the dogs, his father rides in the golf cart while he walks.  He states that he can walk a good 20-30 minutes without stopping if the land is flat.  He says it is less than that if he is on an incline before his left calf tightens up.   After walking about 200-400 yards his left calf hurts, relieved with rest.  He walks 1/2 to 1 mile daily with his dogs.  He denies any non healing wounds.  He says he does have some numbness of the left foot from the ankle down after his back surgery.  He denies non-healing wounds, denies history of MI.  Pt has not had previous LE vascular procedure.  He has had a total of 3 lumbar spine surgeries.   He had L4-5-S1, bone spurs surgery, feel much improved since this, walking with little or no pain.  Pt Diabetic: pre-diabetes, 5.9 A1C in April 2016 Pt smoker: non-smoker, exposed to his wife's second hand smoke until 2014 when she quit.   Pt meds include: Statin :Yes ASA: Yes Other anticoagulants/antiplatelets: no    Past Medical History:  Diagnosis Date  . Cancer ([place])    skin  . Cervicalgia 12/17/2009  . GERD 11/20/2008  . HYPERLIPIDEMIA 11/20/2008  . OBESITY 01/24/2009  . PERIPHERAL VASCULAR DISEASE 01/24/2009  . PREDIABETES 06/12/2010  . RHINITIS 04/03/2009  . SHOULDER PAIN 11/26/2009    Social History Social History   Tobacco Use  . Smoking status: Never Smoker  . Smokeless tobacco: Never Used  Substance Use Topics  . Alcohol use: No  . Drug use: No    Family  History Family History  Problem Relation Age of Onset  . Hypertension Mother   . Hyperlipidemia Mother   . Hypertension Father   . Heart disease Maternal Uncle   . Cancer Paternal Grandfather        prostate cancer    Past Surgical History:  Procedure Laterality Date  . BACK SURGERY  02-29-12   lumbar fusion per Dr. Rennis Harding in [facility]   . KNEE SURGERY    . SHOULDER SURGERY      Allergies  Allergen Reactions  . Amoxicillin-Pot Clavulanate Diarrhea  . Atorvastatin     REACTION: myalgia  . Cephalexin Nausea Only and Nausea And Vomiting  . Oxycodone Hcl Nausea Only    Current Outpatient Medications  Medication Sig Dispense Refill  . aspirin 81 MG tablet Take 81 mg by mouth daily.      . fexofenadine (ALLEGRA) 180 MG tablet Take 180 mg by mouth daily.      . fluticasone (FLONASE) 50 MCG/ACT nasal spray USE 2 SPRAYS INTO EACH NOSTRIL DAILY 16 g 3  . losartan-hydrochlorothiazide (HYZAAR) 100-12.5 MG tablet TAKE 1 TABLET BY MOUTH EVERY DAY 90 tablet 1  . pyridOXINE (VITAMIN B-6) 100 MG tablet Take 100 mg by mouth daily.    . rosuvastatin (CRESTOR) 10 MG tablet Take half tab daily 90  tablet 0  . vitamin B-12 (CYANOCOBALAMIN) 1000 MCG tablet Take 1,000 mcg by mouth daily.    Marland Kitchen LORazepam (ATIVAN) 0.5 MG tablet Take 1 tablet (0.5 mg total) by mouth every 8 (eight) hours as needed. for anxiety (Patient not taking: Reported on 11/24/2017) 30 tablet 0   No current facility-administered medications for this visit.     ROS: See HPI for pertinent positives and negatives.   Physical Examination  Vitals:   11/24/17 1059 11/24/17 1106  BP: (!) 151/85 (!) 144/85  Pulse: 78 78  Resp: 18   Temp: 98.5 F (36.9 C)   TempSrc: Oral   SpO2: 96%   Weight: 296 lb (134.3 kg)   Height: 6\' 1"  (1.854 m)    Body mass index is 39.05 kg/m.  General: A&O x 3, WDWN, obese male. Gait: normal HENT: No gross abnormalities.  Eyes: PERRLA. Pulmonary: Respirations are non labored, CTAB, good air  movement in all fields  Cardiac: regular rhythm, no detected murmur.         Carotid Bruits Right Left   Negative Negative   Radial pulses are 2+ palpable bilaterally   Adominal aortic pulse is not palpable                         VASCULAR EXAM: Extremities without ischemic changes, without Gangrene; without open wounds.                                                                                                           LE Pulses Right Left       FEMORAL  1+ palpable  not palpable        POPLITEAL  not palpable   not palpable       POSTERIOR TIBIAL  1+ palpable   1+ palpable        DORSALIS PEDIS      ANTERIOR TIBIAL 1+ palpable  not palpable    Abdomen: soft, NT, no palpable masses, large panus. Skin: no rashes, no cellulitis, no ulcers noted. Musculoskeletal: no muscle wasting or atrophy.  Neurologic: A&O X 3; appropriate affect, Sensation is normal; MOTOR FUNCTION:  moving all extremities equally, motor strength 5/5 throughout. Speech is fluent/normal. CN 2-12 intact. Psychiatric: Thought content is normal, mood appropriate for clinical situation.    ASSESSMENT: Paul [name] is a 57 y.o. male who has mild claudication in his left calf with walking. His 3 back surgeries have improved his sx's and he is able to walk more. He has never smoked, but has been exposed to his wife's second hand smoke until she quit in 2014.  His atherosclerotic risk factors include pre-diabetes and obesity.   DATA  ABI (Date: 11/24/2017):  R:   ABI: 1.12 (was 1.19 on 11-24-16),   PT: tri  DP: tri  TBI:  1.12 (was 1.01)   L:   ABI: 0.73 (was 0.81),   PT: tri  DP: mono  TBI: 0.52 (was 0.69)  Stable and normal right ABI and TBI with triphasic  waveforms; mild decline in left ABI and TBI with mono and triphasic waveforms; moderate disease in left leg.    PLAN:  Graduated walking program discussed and how to perform. Based on the patient's vascular studies and  examination, pt will return to clinic in 1 year with ABI's. I advised him to notify us if he develops concerns re the circulation in his feet or legs.    I discussed in depth with the patient the nature of atherosclerosis, and emphasized the importance of maximal medical management including strict control of blood pressure, blood glucose, and lipid levels, obtaining regular exercise, and continued cessation of smoking.  The patient is aware that without maximal medical management the underlying atherosclerotic disease process will progress, limiting the benefit of any interventions.  The patient was given information about PAD including signs, symptoms, treatment, what symptoms should prompt the patient to seek immediate medical care, and risk reduction measures to take.  Clemon Chambers, RN, MSN, FNP-C [facility] Phone: [phone]  Clinic MD: [facility]  11/24/17 11:33 AM

## 2017-12-01 DIAGNOSIS — M4716 Other spondylosis with myelopathy, lumbar region: Secondary | ICD-10-CM | POA: Diagnosis not present

## 2017-12-01 DIAGNOSIS — M7918 Myalgia, other site: Secondary | ICD-10-CM | POA: Diagnosis not present

## 2017-12-01 DIAGNOSIS — M545 Low back pain: Secondary | ICD-10-CM | POA: Diagnosis not present

## 2017-12-16 DIAGNOSIS — C44622 Squamous cell carcinoma of skin of right upper limb, including shoulder: Secondary | ICD-10-CM | POA: Diagnosis not present

## 2017-12-30 DIAGNOSIS — M545 Low back pain: Secondary | ICD-10-CM | POA: Diagnosis not present

## 2017-12-30 DIAGNOSIS — M4716 Other spondylosis with myelopathy, lumbar region: Secondary | ICD-10-CM | POA: Diagnosis not present

## 2017-12-30 DIAGNOSIS — M961 Postlaminectomy syndrome, not elsewhere classified: Secondary | ICD-10-CM | POA: Diagnosis not present

## 2017-12-30 DIAGNOSIS — M7918 Myalgia, other site: Secondary | ICD-10-CM | POA: Diagnosis not present

## 2018-01-27 DIAGNOSIS — L905 Scar conditions and fibrosis of skin: Secondary | ICD-10-CM | POA: Diagnosis not present

## 2018-01-27 DIAGNOSIS — Z85828 Personal history of other malignant neoplasm of skin: Secondary | ICD-10-CM | POA: Diagnosis not present

## 2018-01-27 DIAGNOSIS — L570 Actinic keratosis: Secondary | ICD-10-CM | POA: Diagnosis not present

## 2018-02-17 DIAGNOSIS — N400 Enlarged prostate without lower urinary tract symptoms: Secondary | ICD-10-CM | POA: Diagnosis not present

## 2018-04-14 DIAGNOSIS — Z23 Encounter for immunization: Secondary | ICD-10-CM | POA: Diagnosis not present

## 2018-04-23 ENCOUNTER — Other Ambulatory Visit: Payer: Self-pay | Admitting: Family Medicine

## 2018-05-18 DIAGNOSIS — L570 Actinic keratosis: Secondary | ICD-10-CM | POA: Diagnosis not present

## 2018-05-18 DIAGNOSIS — D225 Melanocytic nevi of trunk: Secondary | ICD-10-CM | POA: Diagnosis not present

## 2018-05-18 DIAGNOSIS — L821 Other seborrheic keratosis: Secondary | ICD-10-CM | POA: Diagnosis not present

## 2018-05-18 DIAGNOSIS — L814 Other melanin hyperpigmentation: Secondary | ICD-10-CM | POA: Diagnosis not present

## 2018-06-02 ENCOUNTER — Ambulatory Visit (INDEPENDENT_AMBULATORY_CARE_PROVIDER_SITE_OTHER): Payer: 59 | Admitting: Family Medicine

## 2018-06-02 ENCOUNTER — Other Ambulatory Visit: Payer: Self-pay

## 2018-06-02 ENCOUNTER — Encounter: Payer: Self-pay | Admitting: Family Medicine

## 2018-06-02 VITALS — BP 134/78 | HR 89 | Temp 98.3°F | Ht 73.0 in | Wt 284.7 lb

## 2018-06-02 DIAGNOSIS — Z0000 Encounter for general adult medical examination without abnormal findings: Secondary | ICD-10-CM

## 2018-06-02 DIAGNOSIS — Z23 Encounter for immunization: Secondary | ICD-10-CM

## 2018-06-02 NOTE — Patient Instructions (Signed)
Consider shingles vaccine (Shingrix) and check on insurance coverage if interested.   Come back Tuesday for labs- fasting.

## 2018-06-02 NOTE — Progress Notes (Signed)
Subjective:     Patient ID: Paul [name], male   DOB: 07-10-1961, 57 y.o.   MRN: [id number]  HPI Patient seen for physical exam.  He has chronic problems including obesity, history of prediabetes, hyperlipidemia, hypertension, peripheral vascular disease.  He is followed by vein and vascular surgery yearly.  He remains on Crestor, losartan HCTZ, Prilosec, aspirin.  He relates onset of dry cough a few days ago.  He has been around several people at church with similar cough.  No fever.  Cough fairly well controlled with Delsym cough syrup  Patient needs tetanus booster.  No history of shingles vaccine.  Colonoscopy up-to-date.  He has lost 15 pounds this year did his own efforts.  He does have history of prediabetes range glucose.  No polyuria or polydipsia.  Past Medical History:  Diagnosis Date  . Cancer ([place])    skin  . Cervicalgia 12/17/2009  . GERD 11/20/2008  . HYPERLIPIDEMIA 11/20/2008  . OBESITY 01/24/2009  . PERIPHERAL VASCULAR DISEASE 01/24/2009  . PREDIABETES 06/12/2010  . RHINITIS 04/03/2009  . SHOULDER PAIN 11/26/2009   Past Surgical History:  Procedure Laterality Date  . BACK SURGERY  02-29-12   lumbar fusion per Dr. Rennis Harding in [facility]   . KNEE SURGERY    . SHOULDER SURGERY      reports that he has never smoked. He has never used smokeless tobacco. He reports that he does not drink alcohol or use drugs. family history includes Cancer in his paternal grandfather; Heart disease in his maternal uncle; Hyperlipidemia in his mother; Hypertension in his father and mother. Allergies  Allergen Reactions  . Amoxicillin-Pot Clavulanate Diarrhea  . Atorvastatin     REACTION: myalgia  . Cephalexin Nausea Only and Nausea And Vomiting  . Oxycodone Hcl Nausea Only     Review of Systems  Constitutional: Negative for activity change, appetite change, fatigue, fever and unexpected weight change.  HENT: Negative for congestion, ear pain and trouble swallowing.   Eyes: Negative for  pain and visual disturbance.  Respiratory: Positive for cough. Negative for chest tightness, shortness of breath and wheezing.   Cardiovascular: Negative for chest pain, palpitations and leg swelling.  Gastrointestinal: Negative for abdominal distention, abdominal pain, blood in stool, constipation, diarrhea, nausea, rectal pain and vomiting.  Endocrine: Negative for polydipsia and polyuria.  Genitourinary: Negative for dysuria, hematuria and testicular pain.  Musculoskeletal: Negative for arthralgias and joint swelling.  Skin: Negative for rash.  Neurological: Negative for dizziness, syncope, weakness, light-headedness and headaches.  Hematological: Negative for adenopathy.  Psychiatric/Behavioral: Negative for confusion and dysphoric mood.       Objective:   Physical Exam  Constitutional: He is oriented to person, place, and time. He appears well-developed and well-nourished. No distress.  HENT:  Head: Normocephalic and atraumatic.  Right Ear: External ear normal.  Left Ear: External ear normal.  Mouth/Throat: Oropharynx is clear and moist.  Eyes: Pupils are equal, round, and reactive to light. Conjunctivae and EOM are normal.  Neck: Normal range of motion. Neck supple. No thyromegaly present.  Cardiovascular: Normal rate, regular rhythm and normal heart sounds.  No murmur heard. Pulmonary/Chest: No respiratory distress. He has no wheezes. He has no rales.  Abdominal: Soft. Bowel sounds are normal. He exhibits no distension and no mass. There is no tenderness. There is no rebound and no guarding.  Musculoskeletal: He exhibits no edema.  Lymphadenopathy:    He has no cervical adenopathy.  Neurological: He is alert and oriented to  person, place, and time. He displays normal reflexes. No cranial nerve deficit.  Skin: No rash noted.  Psychiatric: He has a normal mood and affect.       Assessment:     Physical exam.  The following issues were addressed    Plan:     -Obtain lab  work.  He is not fasting today and prefers to come back for fasting labs and return next Tuesday for that.  He had recent PSA through urology so this will not be checked -Tetanus booster given -Discussed shingles vaccine and he will check on insurance coverage first -Continue weight loss efforts  [name] MD [place] [facility] at [facility]

## 2018-06-02 NOTE — Addendum Note (Signed)
Addended by: Anibal Henderson on: 06/02/2018 02:10 PM   Modules accepted: Orders

## 2018-06-06 ENCOUNTER — Other Ambulatory Visit (INDEPENDENT_AMBULATORY_CARE_PROVIDER_SITE_OTHER): Payer: 59

## 2018-06-06 DIAGNOSIS — Z0000 Encounter for general adult medical examination without abnormal findings: Secondary | ICD-10-CM

## 2018-06-06 LAB — CBC WITH DIFFERENTIAL/PLATELET
Basophils Absolute: 0 10*3/uL (ref 0.0–0.1)
Basophils Relative: 0.6 % (ref 0.0–3.0)
EOS ABS: 0.3 10*3/uL (ref 0.0–0.7)
EOS PCT: 5.2 % — AB (ref 0.0–5.0)
HCT: 49 % (ref 39.0–52.0)
Hemoglobin: 16 g/dL (ref 13.0–17.0)
Lymphocytes Relative: 37.8 % (ref 12.0–46.0)
Lymphs Abs: 2.1 10*3/uL (ref 0.7–4.0)
MCHC: 32.7 g/dL (ref 30.0–36.0)
MCV: 84.3 fl (ref 78.0–100.0)
MONO ABS: 0.5 10*3/uL (ref 0.1–1.0)
Monocytes Relative: 9.4 % (ref 3.0–12.0)
NEUTROS PCT: 47 % (ref 43.0–77.0)
Neutro Abs: 2.6 10*3/uL (ref 1.4–7.7)
Platelets: 164 10*3/uL (ref 150.0–400.0)
RBC: 5.81 Mil/uL (ref 4.22–5.81)
RDW: 14.4 % (ref 11.5–15.5)
WBC: 5.4 10*3/uL (ref 4.0–10.5)

## 2018-06-06 LAB — HEPATIC FUNCTION PANEL
ALBUMIN: 4.3 g/dL (ref 3.5–5.2)
ALK PHOS: 79 U/L (ref 39–117)
ALT: 27 U/L (ref 0–53)
AST: 18 U/L (ref 0–37)
BILIRUBIN DIRECT: 0.1 mg/dL (ref 0.0–0.3)
TOTAL PROTEIN: 6.8 g/dL (ref 6.0–8.3)
Total Bilirubin: 0.5 mg/dL (ref 0.2–1.2)

## 2018-06-06 LAB — BASIC METABOLIC PANEL
BUN: 17 mg/dL (ref 6–23)
CALCIUM: 9.3 mg/dL (ref 8.4–10.5)
CO2: 31 meq/L (ref 19–32)
Chloride: 105 mEq/L (ref 96–112)
Creatinine, Ser: 0.92 mg/dL (ref 0.40–1.50)
GFR: 89.73 mL/min (ref >60.00)
GLUCOSE: 113 mg/dL — AB (ref 70–99)
Potassium: 4.6 mEq/L (ref 3.5–5.1)
SODIUM: 143 meq/L (ref 135–145)

## 2018-06-06 LAB — LIPID PANEL
CHOLESTEROL: 107 mg/dL (ref 0–200)
HDL: 34.1 mg/dL — ABNORMAL LOW (ref >39.00)
LDL Cholesterol: 54 mg/dL (ref 0–99)
NonHDL: 72.73
Total CHOL/HDL Ratio: 3
Triglycerides: 92 mg/dL (ref 0.0–149.0)
VLDL: 18.4 mg/dL (ref 0.0–40.0)

## 2018-06-06 LAB — TSH: TSH: 1.01 u[IU]/mL (ref 0.35–4.50)

## 2018-07-08 DIAGNOSIS — S63602A Unspecified sprain of left thumb, initial encounter: Secondary | ICD-10-CM | POA: Diagnosis not present

## 2018-07-16 ENCOUNTER — Other Ambulatory Visit: Payer: Self-pay | Admitting: Family Medicine

## 2018-10-09 ENCOUNTER — Other Ambulatory Visit: Payer: Self-pay | Admitting: Family Medicine

## 2018-10-09 NOTE — Telephone Encounter (Signed)
I see that patient was previously on combination. Just need confirmation that it is OK for separate prescriptions?

## 2018-10-09 NOTE — Telephone Encounter (Signed)
OK to separate.  Apparently pharmacies have had to do this secondary to short supply.

## 2018-11-27 ENCOUNTER — Other Ambulatory Visit: Payer: Self-pay

## 2018-11-27 DIAGNOSIS — I779 Disorder of arteries and arterioles, unspecified: Secondary | ICD-10-CM

## 2018-12-07 ENCOUNTER — Encounter (HOSPITAL_COMMUNITY): Payer: 59

## 2018-12-07 ENCOUNTER — Ambulatory Visit: Payer: 59 | Admitting: Family

## 2018-12-30 ENCOUNTER — Other Ambulatory Visit: Payer: Self-pay | Admitting: Family Medicine

## 2019-01-01 DIAGNOSIS — M961 Postlaminectomy syndrome, not elsewhere classified: Secondary | ICD-10-CM | POA: Diagnosis not present

## 2019-01-01 DIAGNOSIS — M545 Low back pain: Secondary | ICD-10-CM | POA: Diagnosis not present

## 2019-01-01 DIAGNOSIS — M4716 Other spondylosis with myelopathy, lumbar region: Secondary | ICD-10-CM | POA: Diagnosis not present

## 2019-02-12 ENCOUNTER — Ambulatory Visit (INDEPENDENT_AMBULATORY_CARE_PROVIDER_SITE_OTHER): Payer: 59 | Admitting: Family Medicine

## 2019-02-12 ENCOUNTER — Other Ambulatory Visit: Payer: Self-pay

## 2019-02-12 ENCOUNTER — Encounter: Payer: Self-pay | Admitting: Family Medicine

## 2019-02-12 ENCOUNTER — Ambulatory Visit (INDEPENDENT_AMBULATORY_CARE_PROVIDER_SITE_OTHER): Payer: 59

## 2019-02-12 VITALS — BP 112/78 | HR 73 | Temp 97.7°F | Ht 73.0 in | Wt 285.3 lb

## 2019-02-12 DIAGNOSIS — M79645 Pain in left finger(s): Secondary | ICD-10-CM

## 2019-02-12 NOTE — Progress Notes (Signed)
  Subjective:     Patient ID: Paul [name], male   DOB: 1961/01/01, 58 y.o.   MRN: [id number]  HPI Patient is seen with left thumb pain which is persisted for several months.  He states last September he was digging with a shovel and hit a root and felt a jolt of pain in his left hand near the [facility] joint.  He denies any bruising.  He did note some swelling afterwards.  He went to some type of urgent care and reportedly had x-rays which showed no fracture.  He was placed in a thumb spica splint and this seemed to improve temporarily.  He tried some Voltaren gel.  As he has gotten into the summer months and is using his hands more has had increased pain with things like lifting and digging.  Pain seems to be confined mostly to the [facility] joint.  Pain is moderate  Has tried some diclofenac gel and over-the-counter anti-inflammatories with minimal improvement.  Past Medical History:  Diagnosis Date  . Cancer ([place])    skin  . Cervicalgia 12/17/2009  . GERD 11/20/2008  . HYPERLIPIDEMIA 11/20/2008  . OBESITY 01/24/2009  . PERIPHERAL VASCULAR DISEASE 01/24/2009  . PREDIABETES 06/12/2010  . RHINITIS 04/03/2009  . SHOULDER PAIN 11/26/2009   Past Surgical History:  Procedure Laterality Date  . BACK SURGERY  02-29-12   lumbar fusion per Dr. Rennis Harding in [facility]   . KNEE SURGERY    . SHOULDER SURGERY      reports that he has never smoked. He has never used smokeless tobacco. He reports that he does not drink alcohol or use drugs. family history includes Cancer in his paternal grandfather; Heart disease in his maternal uncle; Hyperlipidemia in his mother; Hypertension in his father and mother. Allergies  Allergen Reactions  . Amoxicillin-Pot Clavulanate Diarrhea  . Atorvastatin     REACTION: myalgia  . Cephalexin Nausea Only and Nausea And Vomiting  . Oxycodone Hcl Nausea Only     Review of Systems  Neurological: Negative for weakness and numbness.       Objective:   Physical Exam Constitutional:       Appearance: Normal appearance.  Musculoskeletal:     Comments: Left thumb reveals no ecchymosis.  No visible swelling.  He has some mild tenderness at the [facility] joint.  He has full range of motion in all joints of the thumb.  He has full range of motion in the wrist with no wrist tenderness.  Neurological:     Mental Status: He is alert.        Assessment:     Several month history of persistent left thumb pain left [facility] joint following injury September 2019    Plan:     -Start with plain film x-rays-no acute bony abnormality seen.  This will be over read.  He does have some degenerative changes at the [facility] joint -Given duration of symptoms and not relief with conservative measures we recommend referral to hand orthopedic specialist and he agrees  [name] MD [place] [facility] at [facility]

## 2019-03-14 ENCOUNTER — Other Ambulatory Visit: Payer: Self-pay

## 2019-03-14 ENCOUNTER — Encounter: Payer: Self-pay | Admitting: Family Medicine

## 2019-03-14 ENCOUNTER — Ambulatory Visit (INDEPENDENT_AMBULATORY_CARE_PROVIDER_SITE_OTHER): Payer: 59 | Admitting: Family Medicine

## 2019-03-14 VITALS — BP 124/86 | HR 75 | Temp 98.3°F | Ht 73.0 in | Wt 282.3 lb

## 2019-03-14 DIAGNOSIS — R7303 Prediabetes: Secondary | ICD-10-CM | POA: Diagnosis not present

## 2019-03-14 DIAGNOSIS — R202 Paresthesia of skin: Secondary | ICD-10-CM | POA: Diagnosis not present

## 2019-03-14 DIAGNOSIS — G5792 Unspecified mononeuropathy of left lower limb: Secondary | ICD-10-CM

## 2019-03-14 LAB — HEMOGLOBIN A1C: Hgb A1c MFr Bld: 6 % (ref 4.6–6.5)

## 2019-03-14 LAB — VITAMIN B12: Vitamin B-12: 680 pg/mL (ref 211–911)

## 2019-03-14 MED ORDER — GABAPENTIN 300 MG PO CAPS: 300.0000 mg | ORAL_CAPSULE | Freq: Every day | ORAL | 3 refills | Status: DC

## 2019-03-14 NOTE — Progress Notes (Signed)
Subjective:     Patient ID: Paul [name], male   DOB: 1961-08-02, 58 y.o.   MRN: [id number]  HPI Patient is here to discuss neuropathy symptoms in his left foot and ankle.  He has had multiple back surgeries and has had some chronic intermittent numbness in his foot and leg but more recently has had a burning and stinging type pain which is especially bad at night and interfering with sleep.  His last surgery was 2013.  He is tried some topical diclofenac with minimal improvement.  Denies any weakness.  Pain is quite severe at times.  He has taken gabapentin in the past which seemed to help.  He does have history of some peripheral vascular issues but does not describe any claudication symptoms and is followed regularly by vascular for that.  He has history of prediabetes.  Non-smoker  Past Medical History:  Diagnosis Date  . Cancer ([place])    skin  . Cervicalgia 12/17/2009  . GERD 11/20/2008  . HYPERLIPIDEMIA 11/20/2008  . OBESITY 01/24/2009  . PERIPHERAL VASCULAR DISEASE 01/24/2009  . PREDIABETES 06/12/2010  . RHINITIS 04/03/2009  . SHOULDER PAIN 11/26/2009   Past Surgical History:  Procedure Laterality Date  . BACK SURGERY  02-29-12   lumbar fusion per Dr. Rennis Harding in [facility]   . KNEE SURGERY    . SHOULDER SURGERY      reports that he has never smoked. He has never used smokeless tobacco. He reports that he does not drink alcohol or use drugs. family history includes Cancer in his paternal grandfather; Heart disease in his maternal uncle; Hyperlipidemia in his mother; Hypertension in his father and mother. Allergies  Allergen Reactions  . Amoxicillin-Pot Clavulanate Diarrhea  . Atorvastatin     REACTION: myalgia  . Cephalexin Nausea Only and Nausea And Vomiting  . Oxycodone Hcl Nausea Only     Review of Systems  Constitutional: Negative for fatigue.  Eyes: Negative for visual disturbance.  Respiratory: Negative for cough, chest tightness and shortness of breath.    Cardiovascular: Negative for chest pain, palpitations and leg swelling.  Endocrine: Negative for polydipsia and polyuria.  Neurological: Positive for numbness. Negative for dizziness, syncope, weakness, light-headedness and headaches.       Objective:   Physical Exam Constitutional:      Appearance: He is well-developed.  HENT:     Right Ear: External ear normal.     Left Ear: External ear normal.  Eyes:     Pupils: Pupils are equal, round, and reactive to light.  Neck:     Musculoskeletal: Neck supple.     Thyroid: No thyromegaly.  Cardiovascular:     Rate and Rhythm: Normal rate and regular rhythm.     Comments: Feet are warm to touch with excellent capillary refill.  Dorsalis pedis pulses are somewhat difficult to palpate.  No lesions Pulmonary:     Effort: Pulmonary effort is normal. No respiratory distress.     Breath sounds: Normal breath sounds. No wheezing or rales.  Skin:    Comments: No skin lesions  Neurological:     Mental Status: He is alert and oriented to person, place, and time.     Comments: Full strength lower extremities.  He has intact sensation to touch throughout        Assessment:     Progressive neuropathic pain left ankle and foot.  He has had multiple previous back surgeries as above.  Does take chronic PPI and at some  risk for B12 deficiency.  Also past history of prediabetes    Plan:     -Check B12 and A1c -Recommend trial gabapentin 300 mg nightly.  Cautioned about potential sedation -May consider trial of topical Zostrix cream as well -Touch base if not getting adequate relief with the above  [name] MD [facility] at [facility]

## 2019-03-14 NOTE — Patient Instructions (Signed)
Neuropathic Pain Neuropathic pain is pain caused by damage to the nerves that are responsible for certain sensations in your body (sensory nerves). The pain can be caused by:  Damage to the sensory nerves that send signals to your spinal cord and brain (peripheral nervous system).  Damage to the sensory nerves in your brain or spinal cord (central nervous system). Neuropathic pain can make you more sensitive to pain. Even a minor sensation can feel very painful. This is usually a long-term condition that can be difficult to treat. The type of pain differs from person to person. It may:  Start suddenly (acute), or it may develop slowly and last for a long time (chronic).  Come and go as damaged nerves heal, or it may stay at the same level for years.  Cause emotional distress, loss of sleep, and a lower quality of life. What are the causes? The most common cause of this condition is diabetes. Many other diseases and conditions can also cause neuropathic pain. Causes of neuropathic pain can be classified as:  Toxic. This is caused by medicines and chemicals. The most common cause of toxic neuropathic pain is damage from cancer treatments (chemotherapy).  Metabolic. This can be caused by: ? Diabetes. This is the most common disease that damages the nerves. ? Lack of vitamin B from long-term alcohol abuse.  Traumatic. Any injury that cuts, crushes, or stretches a nerve can cause damage and pain. A common example is feeling pain after losing an arm or leg (phantom limb pain).  Compression-related. If a sensory nerve gets trapped or compressed for a long period of time, the blood supply to the nerve can be cut off.  Vascular. Many blood vessel diseases can cause neuropathic pain by decreasing blood supply and oxygen to nerves.  Autoimmune. This type of pain results from diseases in which the body's defense system (immune system) mistakenly attacks sensory nerves. Examples of autoimmune diseases  that can cause neuropathic pain include lupus and multiple sclerosis.  Infectious. Many types of viral infections can damage sensory nerves and cause pain. Shingles infection is a common cause of this type of pain.  Inherited. Neuropathic pain can be a symptom of many diseases that are passed down through families (genetic). What increases the risk? You are more likely to develop this condition if:  You have diabetes.  You smoke.  You drink too much alcohol.  You are taking certain medicines, including medicines that kill cancer cells (chemotherapy) or that treat immune system disorders. What are the signs or symptoms? The main symptom is pain. Neuropathic pain is often described as:  Burning.  Shock-like.  Stinging.  Hot or cold.  Itching. How is this diagnosed? No single test can diagnose neuropathic pain. It is diagnosed based on:  Physical exam and your symptoms. Your health care provider will ask you about your pain. You may be asked to use a pain scale to describe how bad your pain is.  Tests. These may be done to see if you have a high sensitivity to pain and to help find the cause and location of any sensory nerve damage. They include: ? Nerve conduction studies to test how well nerve signals travel through your sensory nerves (electrodiagnostic testing). ? Stimulating your sensory nerves through electrodes on your skin and measuring the response in your spinal cord and brain (somatosensory evoked potential).  Imaging studies, such as: ? X-rays. ? CT scan. ? MRI. How is this treated? Treatment for neuropathic pain may change   over time. You may need to try different treatment options or a combination of treatments. Some options include:  Treating the underlying cause of the neuropathy, such as diabetes, kidney disease, or vitamin deficiencies.  Stopping medicines that can cause neuropathy, such as chemotherapy.  Medicine to relieve pain. Medicines may  include: ? Prescription or over-the-counter pain medicine. ? Anti-seizure medicine. ? Antidepressant medicines. ? Pain-relieving patches that are applied to painful areas of skin. ? A medicine to numb the area (local anesthetic), which can be injected as a nerve block.  Transcutaneous nerve stimulation. This uses electrical currents to block painful nerve signals. The treatment is painless.  Alternative treatments, such as: ? Acupuncture. ? Meditation. ? Massage. ? Physical therapy. ? Pain management programs. ? Counseling. Follow these instructions at home: Medicines   Take over-the-counter and prescription medicines only as told by your health care provider.  Do not drive or use heavy machinery while taking prescription pain medicine.  If you are taking prescription pain medicine, take actions to prevent or treat constipation. Your health care provider may recommend that you: ? Drink enough fluid to keep your urine pale yellow. ? Eat foods that are high in fiber, such as fresh fruits and vegetables, whole grains, and beans. ? Limit foods that are high in fat and processed sugars, such as fried or sweet foods. ? Take an over-the-counter or prescription medicine for constipation. Lifestyle   Have a good support system at home.  Consider joining a chronic pain support group.  Do not use any products that contain nicotine or tobacco, such as cigarettes and e-cigarettes. If you need help quitting, ask your health care provider.  Do not drink alcohol. General instructions  Learn as much as you can about your condition.  Work closely with all your health care providers to find the treatment plan that works best for you.  Ask your health care provider what activities are safe for you.  Keep all follow-up visits as told by your health care provider. This is important. Contact a health care provider if:  Your pain treatments are not working.  You are having side effects  from your medicines.  You are struggling with tiredness (fatigue), mood changes, depression, or anxiety. Summary  Neuropathic pain is pain caused by damage to the nerves that are responsible for certain sensations in your body (sensory nerves).  Neuropathic pain may come and go as damaged nerves heal, or it may stay at the same level for years.  Neuropathic pain is usually a long-term condition that can be difficult to treat. Consider joining a chronic pain support group. This information is not intended to replace advice given to you by your health care provider. Make sure you discuss any questions you have with your health care provider. Document Released: 05/06/2004 Document Revised: 11/30/2018 Document Reviewed: 08/26/2017 Elsevier Patient Education  2020 Elsevier Inc.  

## 2019-03-26 ENCOUNTER — Other Ambulatory Visit: Payer: Self-pay | Admitting: Family Medicine

## 2019-03-27 ENCOUNTER — Telehealth (HOSPITAL_COMMUNITY): Payer: Self-pay | Admitting: *Deleted

## 2019-03-27 NOTE — Telephone Encounter (Signed)
The above patient or their representative was contacted and gave the following answers to these questions:         Do you have any of the following symptoms?n  Fever                    Cough                   Shortness of breath  Do  you have any of the following other symptoms? n   muscle pain         vomiting,        diarrhea        rash         weakness        red eye        abdominal pain         bruising          bruising or bleeding              joint pain           severe headache    Have you been in contact with someone who was or has been sick in the past 2 weeks?n  Yes                 Unsure                         Unable to assess   Does the person that you were in contact with have any of the following symptoms?   Cough         shortness of breath           muscle pain         vomiting,            diarrhea            rash            weakness           fever            red eye           abdominal pain           bruising  or  bleeding                joint pain                severe headache               Have you  or someone you have been in contact with traveled internationally in th last month?         If yes, which countries?   Have you  or someone you have been in contact with traveled outside [place] in th last month?         If yes, which state and city?   COMMENTS OR ACTION PLAN FOR THIS PATIENT:         

## 2019-03-28 ENCOUNTER — Ambulatory Visit (INDEPENDENT_AMBULATORY_CARE_PROVIDER_SITE_OTHER): Payer: 59 | Admitting: Family

## 2019-03-28 ENCOUNTER — Encounter: Payer: Self-pay | Admitting: Family

## 2019-03-28 ENCOUNTER — Ambulatory Visit (HOSPITAL_COMMUNITY)
Admission: RE | Admit: 2019-03-28 | Discharge: 2019-03-28 | Disposition: A | Discharge status: Home / Self Care | Payer: 59 | Source: Ambulatory Visit | Attending: Family | Admitting: Family

## 2019-03-28 ENCOUNTER — Other Ambulatory Visit: Payer: Self-pay

## 2019-03-28 VITALS — BP 126/81 | HR 70 | Temp 97.9°F | Resp 14 | Ht 71.0 in | Wt 283.0 lb

## 2019-03-28 DIAGNOSIS — I779 Disorder of arteries and arterioles, unspecified: Secondary | ICD-10-CM | POA: Diagnosis not present

## 2019-03-28 NOTE — Progress Notes (Signed)
[facility] [place]   CC: Follow up peripheral artery occlusive disease  History of Present Illness Paul [name] is a 58 y.o. male whom Dr. Nicole [name] has monitored for arterial occlusive disease and claudication of the LLE.   He says that after his last back surgery, it took him 2 years of therapy to completely heal, but is doing well now. He has increased his walking. 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 When he and his father go out to run the dogs, his father rides in the golf cart while he walks. He states that he can walk a good 20-30 minutes without stopping if the land is flat. He says it is less than that if he is on an incline before his left calf tightens up.  After walking about 200-400 yards his left calf hurts, relieved with rest.  He walks 1/2 to 1 mile daily with his dogs.  He denies any non healing wounds. He says he does have some numbness of the left foot from the ankle down after his back surgery.  He denies non-healing wounds, denies history of MI.  Pt has not had previous LE vascular procedure.  He has had a total of 3 lumbar spine surgeries.  He had L4-5-S1, bone spurs surgery, feel much improved since this, walking with little or no pain.  He started taking gabapentin in July 2020 for painful left foot neuropathy.   He lost 17 pounds since his last visit, eating healthier, and is walking more.   Diabetic: pre-diabetes, 6.0 A1C on 03-14-19 Tobacco use: non-smoker, exposed to his wife's second hand smoke until 2014 when she quit.   Pt meds include: Statin :Yes ASA: Yes Other anticoagulants/antiplatelets: no     Past Medical History:  Diagnosis Date  . Cancer ([place])    skin  . Cervicalgia 12/17/2009  . GERD 11/20/2008  . HYPERLIPIDEMIA 11/20/2008  . OBESITY 01/24/2009  . PERIPHERAL VASCULAR DISEASE 01/24/2009  . PREDIABETES 06/12/2010  . RHINITIS 04/03/2009  . SHOULDER PAIN 11/26/2009    Social History Social History    Tobacco Use  . Smoking status: Never Smoker  . Smokeless tobacco: Never Used  Substance Use Topics  . Alcohol use: No  . Drug use: No    Family History Family History  Problem Relation Age of Onset  . Hypertension Mother   . Hyperlipidemia Mother   . Hypertension Father   . Heart disease Maternal Uncle   . Cancer Paternal Grandfather        prostate cancer    Past Surgical History:  Procedure Laterality Date  . BACK SURGERY  02-29-12   lumbar fusion per Dr. Rennis [name] in [facility]   . KNEE SURGERY    . SHOULDER SURGERY      Allergies  Allergen Reactions  . Amoxicillin-Pot Clavulanate Diarrhea  . Atorvastatin     REACTION: myalgia  . Cephalexin Nausea Only and Nausea And Vomiting  . Oxycodone Hcl Nausea Only    Current Outpatient Medications  Medication Sig Dispense Refill  . aspirin 81 MG tablet Take 81 mg by mouth daily.      . baclofen (LIORESAL) 10 MG tablet Take 10 mg by mouth 3 (three) times daily.    . diclofenac sodium (VOLTAREN) 1 % GEL APPLY 4 GRAMS BY TOPICAL ROUTE 4 TIMES A DAY TO THE AFFECTED AREA(S)    . esomeprazole (NEXIUM) 20 MG capsule Take 20 mg by mouth daily at 12 noon.    Marland Kitchen  fexofenadine (ALLEGRA) 180 MG tablet Take 180 mg by mouth daily.      . fluticasone (FLONASE) 50 MCG/ACT nasal spray USE 2 SPRAYS INTO EACH NOSTRIL DAILY 16 g 3  . gabapentin (NEURONTIN) 300 MG capsule Take 1 capsule (300 mg total) by mouth at bedtime. 90 capsule 3  . hydrochlorothiazide (HYDRODIURIL) 12.5 MG tablet TAKE 1 TABLET BY MOUTH EVERY DAY 30 tablet 2  . losartan (COZAAR) 50 MG tablet Take 50 mg by mouth daily.    Marland Kitchen pyridOXINE (VITAMIN B-6) 100 MG tablet Take 100 mg by mouth daily.    . rosuvastatin (CRESTOR) 10 MG tablet TAKE 1/2 TABLET BY MOUTH EVERY DAY 45 tablet 2  . traMADol (ULTRAM) 50 MG tablet Take 50 mg by mouth daily.    . TURMERIC PO Take 1 capsule by mouth daily.    . vitamin B-12 (CYANOCOBALAMIN) 1000 MCG tablet Take 1,000 mcg by mouth daily.     No  current facility-administered medications for this visit.     ROS: See HPI for pertinent positives and negatives.   Physical Examination  Vitals:   03/28/19 1121  BP: 126/81  Pulse: 70  Resp: 14  Temp: 97.9 F (36.6 C)  TempSrc: Temporal  SpO2: 98%  Weight: 283 lb (128.4 kg)  Height: 5\' 11"  (1.803 m)   Body mass index is 39.47 kg/m.  General: A&O x 3, WDWN, obese male. Gait: normal HENT: No gross abnormalities.  Eyes: PERRLA. Pulmonary: Respirations are non labored, CTAB, good air movement in all fields Cardiac: regular rhythm, no detected murmur.         Carotid Bruits Right Left   Negative Negative   Radial pulses are 2+ palpable bilaterally   Adominal aortic pulse is palpable                         VASCULAR EXAM: Extremities without ischemic changes, without Gangrene; without open wounds.                                                                                                          LE Pulses Right Left       FEMORAL  not palpable (obese)  not palpable        POPLITEAL  not palpable   not palpable       POSTERIOR TIBIAL  2+ palpable   2+ palpable        DORSALIS PEDIS      ANTERIOR TIBIAL not palpable  not palpable    Abdomen: soft, NT, no palpable masses. Large panus Skin: no rashes, no cellulitis, no ulcers noted. Musculoskeletal: no muscle wasting or atrophy.  Neurologic: A&O X 3; appropriate affect, Sensation is normal; MOTOR FUNCTION:  moving all extremities equally, motor strength 5/5 throughout. Speech is fluent/normal. CN 2-12 intact. Psychiatric: Thought content is normal, mood appropriate for clinical situation.    DATA  ABI (Date: 03/28/2019): ABI Findings: +---------+------------------+-----+---------+--------+ Right    Rt Pressure (mmHg)IndexWaveform Comment  +---------+------------------+-----+---------+--------+ Brachial 144                                       +---------+------------------+-----+---------+--------+  PTA      183               1.23 triphasic         +---------+------------------+-----+---------+--------+ DP       183               1.23 triphasic         +---------+------------------+-----+---------+--------+ Great Toe125               0.84 Normal            +---------+------------------+-----+---------+--------+  +---------+------------------+-----+--------+-------+ Left     Lt Pressure (mmHg)IndexWaveformComment +---------+------------------+-----+--------+-------+ Brachial 149                                    +---------+------------------+-----+--------+-------+ PTA      128               0.86 biphasic        +---------+------------------+-----+--------+-------+ DP       124               0.83 biphasic        +---------+------------------+-----+--------+-------+ Great Toe92                0.62 Abnormal        +---------+------------------+-----+--------+-------+  +-------+-----------+-----------+------------+------------+ ABI/TBIToday's ABIToday's TBIPrevious ABIPrevious TBI +-------+-----------+-----------+------------+------------+ Right  1.23       0.84       1.12        1.12         +-------+-----------+-----------+------------+------------+ Left   0.86       0.62       0.73        0.52         +-------+-----------+-----------+------------+------------+  Summary: Right: Resting right ankle-brachial index is within normal range. No evidence of significant right lower extremity arterial disease. The right toe-brachial index is normal.  Left: Resting left ankle-brachial index indicates mild left lower extremity arterial disease. The left toe-brachial index is abnormal.   ASSESSMENT: Paul [name] is a 58 y.o. male who has mild claudication in his left calf with walking. His 3 back surgeries have improved his sx's and he is able to walk  more.  ABI in the right remains normal with triphasic waveforms, left ABI has improved from 0.73 to 0.86 with biphasic waveforms.   He has never smoked, but has been exposed to his wife's second hand smoke until she quit in 2014.  His atherosclerotic risk factors include pre-diabetes and obesity.  Continue daily statin and 81 mg ASA.    PLAN:  Based on the patient's vascular studies and examination, pt will return to clinic in 2 years with ABI's. I advised him to notify us if he develops concerns re the circulation in his feet or legs.   Walk a total of at least 30 minutes daily in a safe environment.   I discussed in depth with the patient the nature of atherosclerosis, and emphasized the importance of maximal medical management including strict control of blood pressure, blood glucose, and lipid levels, obtaining regular exercise, and continued cessation of smoking.  The patient is aware that without maximal medical management the underlying atherosclerotic disease process will progress, limiting the benefit of any interventions.  The patient was given information about PAD including signs, symptoms, treatment, what symptoms should prompt the patient to seek immediate medical care, and risk reduction measures to take.  Clemon Chambers, RN, MSN, FNP-C [facility] Phone: [phone]  Clinic MD: Laqueta Due  03/28/19 11:54 AM

## 2019-03-28 NOTE — Patient Instructions (Signed)
Peripheral Vascular Disease  Peripheral vascular disease (PVD) is a disease of the blood vessels that are not part of your heart and brain. A simple term for PVD is poor circulation. In most cases, PVD narrows the blood vessels that carry blood from your heart to the rest of your body. This can reduce the supply of blood to your arms, legs, and internal organs, like your stomach or kidneys. However, PVD most often affects a person's lower legs and feet. Without treatment, PVD tends to get worse. PVD can also lead to acute ischemic limb. This is when an arm or leg suddenly cannot get enough blood. This is a medical emergency. Follow these instructions at home: Lifestyle  Do not use any products that contain nicotine or tobacco, such as cigarettes and e-cigarettes. If you need help quitting, ask your doctor.  Lose weight if you are overweight. Or, stay at a healthy weight as told by your doctor.  Eat a diet that is low in fat and cholesterol. If you need help, ask your doctor.  Exercise regularly. Ask your doctor for activities that are right for you. General instructions  Take over-the-counter and prescription medicines only as told by your doctor.  Take good care of your feet: ? Wear comfortable shoes that fit well. ? Check your feet often for any cuts or sores.  Keep all follow-up visits as told by your doctor This is important. Contact a doctor if:  You have cramps in your legs when you walk.  You have leg pain when you are at rest.  You have coldness in a leg or foot.  Your skin changes.  You are unable to get or have an erection (erectile dysfunction).  You have cuts or sores on your feet that do not heal. Get help right away if:  Your arm or leg turns cold, numb, and blue.  Your arms or legs become red, warm, swollen, painful, or numb.  You have chest pain.  You have trouble breathing.  You suddenly have weakness in your face, arm, or leg.  You become very  confused or you cannot speak.  You suddenly have a very bad headache.  You suddenly cannot see. Summary  Peripheral vascular disease (PVD) is a disease of the blood vessels.  A simple term for PVD is poor circulation. Without treatment, PVD tends to get worse.  Treatment may include exercise, low fat and low cholesterol diet, and quitting smoking. This information is not intended to replace advice given to you by your health care provider. Make sure you discuss any questions you have with your health care provider. Document Released: 11/03/2009 Document Revised: 07/22/2017 Document Reviewed: 09/16/2016 Elsevier Patient Education  2020 Elsevier Inc.  

## 2019-03-29 ENCOUNTER — Telehealth: Payer: Self-pay | Admitting: Family Medicine

## 2019-03-29 NOTE — Telephone Encounter (Signed)
Medication: gabapentin (NEURONTIN) 300 MG capsule     Patient inquired if he is able to switch from 300 mg to 100mg  as this medication has been making him feel dizzy and "fuzzy headed" even if he takes it before bed.

## 2019-03-30 ENCOUNTER — Other Ambulatory Visit: Payer: Self-pay

## 2019-03-30 MED ORDER — GABAPENTIN 100 MG PO CAPS: ORAL_CAPSULE | ORAL | 3 refills | Status: DC

## 2019-03-30 NOTE — Telephone Encounter (Signed)
Please see message.  Please advise. 

## 2019-03-30 NOTE — Telephone Encounter (Signed)
We can    I suggest tapering down gradually.  Can send in Gabapentin 100 mg -take two at night for one week and then decrease to one po qhs  #90 with 3 refills.

## 2019-03-30 NOTE — Telephone Encounter (Signed)
Patient has been called and given message. Prescription has been sent to [facility] pharmacy.

## 2019-04-07 ENCOUNTER — Other Ambulatory Visit: Payer: Self-pay

## 2019-04-07 ENCOUNTER — Ambulatory Visit (INDEPENDENT_AMBULATORY_CARE_PROVIDER_SITE_OTHER): Payer: 59 | Admitting: Family Medicine

## 2019-04-07 DIAGNOSIS — Z20822 Contact with and (suspected) exposure to covid-19: Secondary | ICD-10-CM

## 2019-04-07 DIAGNOSIS — Z20828 Contact with and (suspected) exposure to other viral communicable diseases: Secondary | ICD-10-CM | POA: Diagnosis not present

## 2019-04-07 NOTE — Progress Notes (Signed)
Patient ID: Paul [name], male   DOB: 1960/09/29, 58 y.o.   MRN: [id number]  This visit type was conducted due to national recommendations for restrictions regarding the COVID-19 pandemic in an effort to limit this patient's exposure and mitigate transmission in our community.   Virtual Visit via Telephone Note  I connected with Paul [name] Monday on 04/07/19 at  9:20 AM EDT by telephone and verified that I am speaking with the correct person using two identifiers.   I discussed the limitations, risks, security and privacy concerns of performing an evaluation and management service by telephone and the availability of in person appointments. I also discussed with the patient that there may be a patient responsible charge related to this service. The patient expressed understanding and agreed to proceed.  Location patient: home Location provider: work or home office Participants present for the call: patient, provider Patient did not have a visit in the prior 7 days to address this/these issue(s).   History of Present Illness: Patient calls to consult about COVID testing.  He states his wife was getting ready to have rotator cuff surgery and last Wednesday had COVID test which came back positive yesterday.  His wife does have COPD history.  She is totally asymptomatic and has been relatively isolated.  She has not had any cough, fever, dyspnea.  Patient likewise has had no symptoms whatsoever.  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  Neither of them have had any known positive exposures otherwise.   Observations/Objective: Patient sounds cheerful and well on the phone. I do not appreciate any SOB. Speech and thought processing are grossly intact. Patient reported vitals:  Assessment and Plan:  Close exposure to spouse who tested positive for COVID.  Both patient and patient's spouse are asymptomatic at this time  -Future order placed for COVID-19 testing at  [facility] location.  He will go Monday morning for that.  He knows to stay quarantined in the meantime. -Touch base for any dyspnea or other concerns  Follow Up Instructions:  -For COVID-19 testing Monday   99441 5-10 99442 11-20 99443 21-30 I did not refer this patient for an OV in the next 24 hours for this/these issue(s).  I discussed the assessment and treatment plan with the patient. The patient was provided an opportunity to ask questions and all were answered. The patient agreed with the plan and demonstrated an understanding of the instructions.   The patient was advised to call back or seek an in-person evaluation if the symptoms worsen or if the condition fails to improve as anticipated.  I provided 15 minutes of non-face-to-face time during this encounter.   Carolann Littler, MD

## 2019-04-09 ENCOUNTER — Other Ambulatory Visit: Payer: Self-pay

## 2019-04-09 DIAGNOSIS — Z20822 Contact with and (suspected) exposure to covid-19: Secondary | ICD-10-CM

## 2019-04-11 LAB — NOVEL CORONAVIRUS, NAA: SARS-CoV-2, NAA: NOT DETECTED

## 2019-04-13 ENCOUNTER — Telehealth: Payer: Self-pay | Admitting: Family Medicine

## 2019-04-13 NOTE — Telephone Encounter (Signed)
Called patient and gave him the message to take half in the morning and half at night and if it does not work he will take one pill in the morning and one pill in the evening. He will call back to let us know what he is taking for his refills.

## 2019-04-13 NOTE — Telephone Encounter (Signed)
Pt states that the gabapentin (NEURONTIN) 100 MG capsule isn't lasting him all day.  Wants to know if he can take a dose in the morning and a dose at night.

## 2019-04-13 NOTE — Telephone Encounter (Signed)
Please advise 

## 2019-04-13 NOTE — Telephone Encounter (Signed)
Yes.   Can be dosed up to tid.

## 2019-04-13 NOTE — Telephone Encounter (Signed)
See note

## 2019-04-16 ENCOUNTER — Other Ambulatory Visit: Payer: Self-pay

## 2019-04-16 MED ORDER — GABAPENTIN 100 MG PO CAPS: 100.0000 mg | ORAL_CAPSULE | Freq: Two times a day (BID) | ORAL | 1 refills | Status: DC

## 2019-04-16 NOTE — Telephone Encounter (Signed)
Called patient and let him know that Dr. Elease Hashimoto stated that he could take one of the 100 mg of Gabapentin BID. I advised this to the patient and sent in a new prescription for him. Patient verbalized an understanding and stated that he will let us know if this does not help him.

## 2019-04-16 NOTE — Telephone Encounter (Signed)
Pt calling back and states that the 50mg  gabapentin is not lasting all day and would like to increase to a 100mg  twice a day. Please advise

## 2019-04-17 ENCOUNTER — Telehealth: Payer: Self-pay

## 2019-04-17 NOTE — Telephone Encounter (Signed)
Noted.   I don't think he would have to quarantine at this time.

## 2019-04-17 NOTE — Telephone Encounter (Signed)
Please see message.  Copied from [place] [id number]. Topic: General - Other >> Apr 17, 2019  1:20 PM Alanda Slim E wrote: Reason for CRM: Pt was advise to let Dr. Elease Hashimoto know the results from covid test. Pt test was negative/ not detected/ please advise

## 2019-04-18 ENCOUNTER — Other Ambulatory Visit: Payer: Self-pay | Admitting: Family Medicine

## 2019-04-18 NOTE — Telephone Encounter (Signed)
Patient notified of PCP response to question.

## 2019-04-18 NOTE — Telephone Encounter (Signed)
Left message on machine for patient to return our call CRM 

## 2019-04-21 ENCOUNTER — Other Ambulatory Visit: Payer: Self-pay | Admitting: Family Medicine

## 2019-04-21 MED ORDER — GABAPENTIN 100 MG PO CAPS: 100.0000 mg | ORAL_CAPSULE | Freq: Two times a day (BID) | ORAL | 1 refills | Status: DC

## 2019-04-21 NOTE — Progress Notes (Signed)
Received call from nurse line that pharmacy needed clarification regarding gabapentin.  Reviewed note that he should be taking 100mg  bid.  Rx with updated sig sent in.

## 2019-05-11 ENCOUNTER — Ambulatory Visit: Payer: Self-pay | Admitting: *Deleted

## 2019-05-11 ENCOUNTER — Other Ambulatory Visit: Payer: Self-pay | Admitting: Family Medicine

## 2019-05-11 NOTE — Telephone Encounter (Signed)
   Reason for Disposition . [1] MODERATE dizziness (e.g., interferes with normal activities) AND [2] has NOT been evaluated by physician for this  (Exception: dizziness caused by heat exposure, sudden standing, or poor fluid intake)  Answer Assessment - Initial Assessment Questions 1. DESCRIPTION: "Describe your dizziness."     "Swimmy headed" 2. LIGHTHEADED: "Do you feel lightheaded?" (e.g., somewhat faint, woozy, weak upon standing)     Worse with standing up and walking  3. VERTIGO: "Do you feel like either you or the room is spinning or tilting?" (i.e. vertigo)     Did not feel like room was spinning or tilting 4. SEVERITY: "How bad is it?"  "Do you feel like you are going to faint?" "Can you stand and walk?"   - MILD - walking normally   - MODERATE - interferes with normal activities (e.g., work, school)    - SEVERE - unable to stand, requires support to walk, feels like passing out now.      Mild to moderate.  Moderate with standing  5. ONSET:  "When did the dizziness begin?"     This morning  6. AGGRAVATING FACTORS: "Does anything make it worse?" (e.g., standing, change in head position)     Standing  7. HEART RATE: "Can you tell me your heart rate?" "How many beats in 15 seconds?"  (Note: not all patients can do this)      Patient did not know how to check 8. CAUSE: "What do you think is causing the dizziness?"    Unsure  9. RECURRENT SYMPTOM: "Have you had dizziness before?" If so, ask: "When was the last time?" "What happened that time?"     Has not experienced these symptoms before  10. OTHER SYMPTOMS: "Do you have any other symptoms?" (e.g., fever, chest pain, vomiting, diarrhea, bleeding)       Sore throat, got hot and sweaty "clamy" when experiencing the dizziness Denies chest pain, shortness of breath, fever, vomiting, diarrhea, bleeding 11. PREGNANCY: "Is there any chance you are pregnant?" "When was your last menstrual period?"       N/A  Protocols used: DIZZINESS [facility]   Patient states he had a headache last night, and today felt dizzy when he work up, and had a "scratchy throat".  He got up and started moving around at home and felt better.  He decided to go to the grocery store, and while at the store he got dizzy again, and felt hot and sweaty.  He denies any chest pain, shortness of breath, fever, numbness or weakness.  He states his dizziness was moderate while in the store, it has improved since he sat down.  He is still sweating, but it is improving also.  Called patient's PCP office to get an appointment due to his episode of moderate dizziness, they have no available appointments today.  Advised patient that he should have someone drive him to [facility] in [place] for evaluation.  Patient expressed understanding and is agreeable.

## 2019-05-11 NOTE — Telephone Encounter (Signed)
Called patient and he stated that he thinks that he is getting a cold. He stated that he went to the [facility] and it was over an hour wait so he got some Mucinex and went home and took this. I advised that if he is not getting better or getting worse to go to the [facility], [facility]gent Care, or Hospital if he needs to. Patient verbalized an understanding. Patient will call back on Monday if he is better but still not well.

## 2019-05-21 ENCOUNTER — Other Ambulatory Visit: Payer: Self-pay | Admitting: Family Medicine

## 2019-05-23 NOTE — Telephone Encounter (Signed)
Pt following up on refills on refill request.

## 2019-06-04 ENCOUNTER — Encounter: Payer: Self-pay | Admitting: Family Medicine

## 2019-06-04 ENCOUNTER — Ambulatory Visit (INDEPENDENT_AMBULATORY_CARE_PROVIDER_SITE_OTHER): Payer: 59 | Admitting: Family Medicine

## 2019-06-04 ENCOUNTER — Other Ambulatory Visit: Payer: Self-pay

## 2019-06-04 VITALS — BP 128/80 | HR 68 | Temp 97.9°F | Resp 16 | Ht 71.0 in | Wt 285.3 lb

## 2019-06-04 DIAGNOSIS — Z0000 Encounter for general adult medical examination without abnormal findings: Secondary | ICD-10-CM

## 2019-06-04 DIAGNOSIS — Z125 Encounter for screening for malignant neoplasm of prostate: Secondary | ICD-10-CM

## 2019-06-04 DIAGNOSIS — H8111 Benign paroxysmal vertigo, right ear: Secondary | ICD-10-CM | POA: Diagnosis not present

## 2019-06-04 LAB — CBC WITH DIFFERENTIAL/PLATELET
Basophils Absolute: 0 10*3/uL (ref 0.0–0.1)
Basophils Relative: 0.7 % (ref 0.0–3.0)
Eosinophils Absolute: 0.3 10*3/uL (ref 0.0–0.7)
Eosinophils Relative: 5.8 % — ABNORMAL HIGH (ref 0.0–5.0)
HCT: 48.4 % (ref 39.0–52.0)
Hemoglobin: 16.2 g/dL (ref 13.0–17.0)
Lymphocytes Relative: 32.3 % (ref 12.0–46.0)
Lymphs Abs: 1.7 10*3/uL (ref 0.7–4.0)
MCHC: 33.4 g/dL (ref 30.0–36.0)
MCV: 85.2 fl (ref 78.0–100.0)
Monocytes Absolute: 0.5 10*3/uL (ref 0.1–1.0)
Monocytes Relative: 10.4 % (ref 3.0–12.0)
Neutro Abs: 2.7 10*3/uL (ref 1.4–7.7)
Neutrophils Relative %: 50.8 % (ref 43.0–77.0)
Platelets: 164 10*3/uL (ref 150.0–400.0)
RBC: 5.69 Mil/uL (ref 4.22–5.81)
RDW: 14.7 % (ref 11.5–15.5)
WBC: 5.2 10*3/uL (ref 4.0–10.5)

## 2019-06-04 LAB — BASIC METABOLIC PANEL
BUN: 18 mg/dL (ref 6–23)
CO2: 31 mEq/L (ref 19–32)
Calcium: 9.2 mg/dL (ref 8.4–10.5)
Chloride: 105 mEq/L (ref 96–112)
Creatinine, Ser: 0.97 mg/dL (ref 0.40–1.50)
GFR: 79.15 mL/min (ref >60.00)
Glucose, Bld: 109 mg/dL — ABNORMAL HIGH (ref 70–99)
Potassium: 4.3 mEq/L (ref 3.5–5.1)
Sodium: 142 mEq/L (ref 135–145)

## 2019-06-04 LAB — LIPID PANEL
Cholesterol: 102 mg/dL (ref 0–200)
HDL: 38.8 mg/dL — ABNORMAL LOW (ref >39.00)
LDL Cholesterol: 48 mg/dL (ref 0–99)
NonHDL: 63.65
Total CHOL/HDL Ratio: 3
Triglycerides: 76 mg/dL (ref 0.0–149.0)
VLDL: 15.2 mg/dL (ref 0.0–40.0)

## 2019-06-04 LAB — TSH: TSH: 1 u[IU]/mL (ref 0.35–4.50)

## 2019-06-04 LAB — PSA: PSA: 3.42 ng/mL (ref 0.10–4.00)

## 2019-06-04 LAB — HEPATIC FUNCTION PANEL
ALT: 20 U/L (ref 0–53)
AST: 15 U/L (ref 0–37)
Albumin: 4.3 g/dL (ref 3.5–5.2)
Alkaline Phosphatase: 78 U/L (ref 39–117)
Bilirubin, Direct: 0.1 mg/dL (ref 0.0–0.3)
Total Bilirubin: 0.5 mg/dL (ref 0.2–1.2)
Total Protein: 6.6 g/dL (ref 6.0–8.3)

## 2019-06-04 NOTE — Progress Notes (Signed)
Subjective:     Patient ID: Paul [name], male   DOB: 09-25-1960, 58 y.o.   MRN: [id number]  HPI   [place] seen today for physical exam-and for new medical problems as below.  Chronic problems include obesity, hypertension, GERD, hyperlipidemia, chronic neck and back pain.  He is doing fairly well currently with regimen of gabapentin 100 mg twice daily.  Tetanus is up-to-date.  Will need colonoscopy next year.  No history of shingles vaccine.  Has already had flu vaccine.  Blood pressures been well controlled by home readings.  Strong family history of hypertension in mother.  His mother is 49 and otherwise doing well.  Father is 61.  His major issue is he has had some dizziness for 2 weeks.  He describes as vertigo which is worse when he rolls over in bed to the right side.  He denies any speech change, swallowing difficulty, focal weakness, or ataxia.  No alleviating factors.  Reviewed with no changes: Past Medical History:  Diagnosis Date  . Cancer ([place])    skin  . Cervicalgia 12/17/2009  . GERD 11/20/2008  . HYPERLIPIDEMIA 11/20/2008  . OBESITY 01/24/2009  . PERIPHERAL VASCULAR DISEASE 01/24/2009  . PREDIABETES 06/12/2010  . RHINITIS 04/03/2009  . SHOULDER PAIN 11/26/2009   Past Surgical History:  Procedure Laterality Date  . BACK SURGERY  02-29-12   lumbar fusion per Dr. Rennis Harding in [facility]   . KNEE SURGERY    . SHOULDER SURGERY      reports that he has never smoked. He has never used smokeless tobacco. He reports that he does not drink alcohol or use drugs. family history includes Cancer in his paternal grandfather; Heart disease in his maternal uncle; Hyperlipidemia in his mother; Hypertension in his father and mother. Allergies  Allergen Reactions  . Amoxicillin-Pot Clavulanate Diarrhea  . Atorvastatin     REACTION: myalgia  . Cephalexin Nausea Only and Nausea And Vomiting  . Oxycodone Hcl Nausea Only     Review of Systems  Constitutional: Negative for activity change,  appetite change, fatigue, fever and unexpected weight change.  HENT: Negative for congestion, ear pain and trouble swallowing.   Eyes: Negative for pain and visual disturbance.  Respiratory: Negative for cough, shortness of breath and wheezing.   Cardiovascular: Negative for chest pain and palpitations.  Gastrointestinal: Negative for abdominal distention, abdominal pain, blood in stool, constipation, diarrhea, nausea, rectal pain and vomiting.  Genitourinary: Negative for dysuria, hematuria and testicular pain.  Musculoskeletal: Negative for arthralgias and joint swelling.  Skin: Negative for rash.  Neurological: Positive for dizziness. Negative for seizures, syncope, speech difficulty, weakness and headaches.  Hematological: Negative for adenopathy.  Psychiatric/Behavioral: Negative for confusion and dysphoric mood.       Objective:   Physical Exam Constitutional:      General: He is not in acute distress.    Appearance: He is well-developed.  HENT:     Head: Normocephalic and atraumatic.     Right Ear: External ear normal.     Left Ear: External ear normal.  Eyes:     Conjunctiva/sclera: Conjunctivae normal.     Pupils: Pupils are equal, round, and reactive to light.  Neck:     Musculoskeletal: Normal range of motion and neck supple.     Thyroid: No thyromegaly.  Cardiovascular:     Rate and Rhythm: Normal rate and regular rhythm.     Heart sounds: Normal heart sounds. No murmur.  Pulmonary:  Effort: No respiratory distress.     Breath sounds: No wheezing or rales.  Abdominal:     General: Bowel sounds are normal. There is no distension.     Palpations: Abdomen is soft. There is no mass.     Tenderness: There is no abdominal tenderness. There is no guarding or rebound.  Lymphadenopathy:     Cervical: No cervical adenopathy.  Skin:    Findings: No rash.  Neurological:     Mental Status: He is alert and oriented to person, place, and time.     Cranial Nerves: No  cranial nerve deficit.     Deep Tendon Reflexes: Reflexes normal.     Comments: He does have reproducible vertigo when lying backwards with head tilted to the right but not the left No focal weakness.  No ataxia.  Gait normal.        Assessment:     #1 physical exam.  We discussed the following health maintenance issues  #2 benign peripheral positional vertigo to the right    Plan:     -Obtain screening labs including PSA -Flu vaccine already given -We discussed shingles vaccine and he will check on insurance coverage -Handout given for Epley maneuvers and we reviewed these.  He will try these at home and be in touch if vertigo not resolving in the next week -Reviewed red flags for more worrisome symptoms of vertigo  [name] MD [facility]

## 2019-06-04 NOTE — Patient Instructions (Signed)
Consider shingles vaccine(Shingrix) and check on insurance coverage if interested.     Benign Positional Vertigo Vertigo is the feeling that you or your surroundings are moving when they are not. Benign positional vertigo is the most common form of vertigo. This is usually a harmless condition (benign). This condition is positional. This means that symptoms are triggered by certain movements and positions. This condition can be dangerous if it occurs while you are doing something that could cause harm to you or others. This includes activities such as driving or operating machinery. What are the causes? In many cases, the cause of this condition is not known. It may be caused by a disturbance in an area of the inner ear that helps your brain to sense movement and balance. This disturbance can be caused by:  Viral infection (labyrinthitis).  Head injury.  Repetitive motion, such as jumping, dancing, or running. What increases the risk? You are more likely to develop this condition if:  You are a woman.  You are 51 years of age or older. What are the signs or symptoms? Symptoms of this condition usually happen when you move your head or your eyes in different directions. Symptoms may start suddenly, and usually last for less than a minute. They include:  Loss of balance and falling.  Feeling like you are spinning or moving.  Feeling like your surroundings are spinning or moving.  Nausea and vomiting.  Blurred vision.  Dizziness.  Involuntary eye movement (nystagmus). Symptoms can be mild and cause only minor problems, or they can be severe and interfere with daily life. Episodes of benign positional vertigo may return (recur) over time. Symptoms may improve over time. How is this diagnosed? This condition may be diagnosed based on:  Your medical history.  Physical exam of the head, neck, and ears.  Tests, such as: ? MRI. ? CT scan. ? Eye movement tests. Your health care  provider may ask you to change positions quickly while he or she watches you for symptoms of benign positional vertigo, such as nystagmus. Eye movement may be tested with a variety of exams that are designed to evaluate or stimulate vertigo. ? An electroencephalogram (EEG). This records electrical activity in your brain. ? Hearing tests. You may be referred to a health care provider who specializes in ear, nose, and throat (ENT) problems (otolaryngologist) or a provider who specializes in disorders of the nervous system (neurologist). How is this treated?  This condition may be treated in a session in which your health care provider moves your head in specific positions to adjust your inner ear back to normal. Treatment for this condition may take several sessions. Surgery may be needed in severe cases, but this is rare. In some cases, benign positional vertigo may resolve on its own in 2-4 weeks. Follow these instructions at home: Safety  Move slowly. Avoid sudden body or head movements or certain positions, as told by your health care provider.  Avoid driving until your health care provider says it is safe for you to do so.  Avoid operating heavy machinery until your health care provider says it is safe for you to do so.  Avoid doing any tasks that would be dangerous to you or others if vertigo occurs.  If you have trouble walking or keeping your balance, try using a cane for stability. If you feel dizzy or unstable, sit down right away.  Return to your normal activities as told by your health care provider. Ask your  health care provider what activities are safe for you. General instructions  Take over-the-counter and prescription medicines only as told by your health care provider.  Drink enough fluid to keep your urine pale yellow.  Keep all follow-up visits as told by your health care provider. This is important. Contact a health care provider if:  You have a fever.  Your  condition gets worse or you develop new symptoms.  Your family or friends notice any behavioral changes.  You have nausea or vomiting that gets worse.  You have numbness or a "pins and needles" sensation. Get help right away if you:  Have difficulty speaking or moving.  Are always dizzy.  Faint.  Develop severe headaches.  Have weakness in your legs or arms.  Have changes in your hearing or vision.  Develop a stiff neck.  Develop sensitivity to light. Summary  Vertigo is the feeling that you or your surroundings are moving when they are not. Benign positional vertigo is the most common form of vertigo.  The cause of this condition is not known. It may be caused by a disturbance in an area of the inner ear that helps your brain to sense movement and balance.  Symptoms include loss of balance and falling, feeling that you or your surroundings are moving, nausea and vomiting, and blurred vision.  This condition can be diagnosed based on symptoms, physical exam, and other tests, such as MRI, CT scan, eye movement tests, and hearing tests.  Follow safety instructions as told by your health care provider. You will also be told when to contact your health care provider in case of problems. This information is not intended to replace advice given to you by your health care provider. Make sure you discuss any questions you have with your health care provider. Document Released: 05/17/2006 Document Revised: 01/18/2018 Document Reviewed: 01/18/2018 Elsevier Patient Education  2020 [employer].

## 2019-06-17 ENCOUNTER — Other Ambulatory Visit: Payer: Self-pay | Admitting: Family Medicine

## 2019-07-10 ENCOUNTER — Other Ambulatory Visit: Payer: Self-pay | Admitting: Family Medicine

## 2019-08-06 ENCOUNTER — Other Ambulatory Visit: Payer: Self-pay | Admitting: Family Medicine

## 2019-08-24 DIAGNOSIS — C61 Malignant neoplasm of prostate: Secondary | ICD-10-CM

## 2019-08-24 HISTORY — PX: COLONOSCOPY: SHX174

## 2019-08-24 HISTORY — DX: Malignant neoplasm of prostate: C61

## 2019-10-07 ENCOUNTER — Other Ambulatory Visit: Payer: Self-pay | Admitting: Family Medicine

## 2019-10-09 NOTE — Telephone Encounter (Signed)
BB-Plz see refill req/thx dmf

## 2019-10-27 ENCOUNTER — Other Ambulatory Visit: Payer: Self-pay | Admitting: Family Medicine

## 2019-11-08 ENCOUNTER — Telehealth: Payer: Self-pay | Admitting: Family Medicine

## 2019-11-08 DIAGNOSIS — Z1211 Encounter for screening for malignant neoplasm of colon: Secondary | ICD-10-CM

## 2019-11-08 NOTE — Telephone Encounter (Signed)
He is due for colonoscopy this year.  He is higher risk b/o obesity, hypertension, hx of peripheral vascular disease, and prediabetes and should consider Covid vaccine.

## 2019-11-08 NOTE — Telephone Encounter (Signed)
Patient is aware and referral placed 

## 2019-11-08 NOTE — Telephone Encounter (Signed)
Pt wants to know with his obesity and high blood pressure does he qualify to get covid vaccine with the active group?   Pt also wants to know how would he go about getting his colonoscopy due to him coming up on his 10 year since the last one.    Please follow up pt. [phone]

## 2019-11-23 ENCOUNTER — Other Ambulatory Visit: Payer: Self-pay | Admitting: Family Medicine

## 2020-01-19 ENCOUNTER — Other Ambulatory Visit: Payer: Self-pay | Admitting: Family Medicine

## 2020-01-21 ENCOUNTER — Other Ambulatory Visit: Payer: Self-pay | Admitting: Family Medicine

## 2020-03-24 ENCOUNTER — Encounter: Payer: Self-pay | Admitting: Family Medicine

## 2020-03-24 ENCOUNTER — Other Ambulatory Visit: Payer: Self-pay

## 2020-03-24 ENCOUNTER — Ambulatory Visit: Payer: 59 | Admitting: Family Medicine

## 2020-03-24 VITALS — BP 144/84 | HR 81 | Temp 97.8°F | Wt 293.9 lb

## 2020-03-24 DIAGNOSIS — M25561 Pain in right knee: Secondary | ICD-10-CM

## 2020-03-24 NOTE — Progress Notes (Signed)
Established Patient Office Visit  Subjective:  Patient ID: Paul [name], male    DOB: 1961/07/19  Age: 59 y.o. MRN: [id number]  CC:  Chief Complaint  Patient presents with  . Knee Pain    right knee pain. pt fell about 2 to 3 weeks ago and twisted it states it has been getting worse     HPI Paul [name] presents for right knee pain.  Started a couple of weeks ago.  He states he was helping to hold a ladder for someone and he slipped and fell.  Is not sure exactly how he went down.  Does not recall any significant pressure with direct impact on the knee.  This was more of a twisting type injury.  He has had past history of meniscus tear the knee.  Is had some mild swelling.  No instability.  No locking or giving way.  He has tried some topical diclofenac and occasional icing.  Has had remote history of arthroscopic surgery previously for meniscus tear  Past Medical History:  Diagnosis Date  . Cancer ([place])    skin  . Cervicalgia 12/17/2009  . GERD 11/20/2008  . HYPERLIPIDEMIA 11/20/2008  . OBESITY 01/24/2009  . PERIPHERAL VASCULAR DISEASE 01/24/2009  . PREDIABETES 06/12/2010  . RHINITIS 04/03/2009  . SHOULDER PAIN 11/26/2009    Past Surgical History:  Procedure Laterality Date  . BACK SURGERY  02-29-12   lumbar fusion per Dr. Rennis Harding in [facility]   . KNEE SURGERY    . SHOULDER SURGERY      Family History  Problem Relation Age of Onset  . Hypertension Mother   . Hyperlipidemia Mother   . Hypertension Father   . Heart disease Maternal Uncle   . Cancer Paternal Grandfather        prostate cancer    Social History   Socioeconomic History  . Marital status: Married    Spouse name: Not on file  . Number of children: Not on file  . Years of education: Not on file  . Highest education level: Not on file  Occupational History  . Not on file  Tobacco Use  . Smoking status: Never Smoker  . Smokeless tobacco: Never Used  Vaping Use  . Vaping Use: Never used  Substance and  Sexual Activity  . Alcohol use: No  . Drug use: No  . Sexual activity: Yes  Other Topics Concern  . Not on file  Social History Narrative  . Not on file   Social Determinants of Health   Financial Resource Strain:   . Difficulty of Paying Living Expenses:   Food Insecurity:   . Worried About Charity fundraiser in the Last Year:   . Arboriculturist in the Last Year:   Transportation Needs:   . Film/video editor (Medical):   Marland Kitchen Lack of Transportation (Non-Medical):   Physical Activity:   . Days of Exercise per Week:   . Minutes of Exercise per Session:   Stress:   . Feeling of Stress :   Social Connections:   . Frequency of Communication with Friends and Family:   . Frequency of Social Gatherings with Friends and Family:   . Attends Religious Services:   . Active Member of Clubs or Organizations:   . Attends Archivist Meetings:   Marland Kitchen Marital Status:   Intimate Partner Violence:   . Fear of Current or Ex-Partner:   . Emotionally Abused:   .  Physically Abused:   . Sexually Abused:     Outpatient Medications Prior to Visit  Medication Sig Dispense Refill  . aspirin 81 MG tablet Take 81 mg by mouth daily.      . baclofen (LIORESAL) 10 MG tablet Take 10 mg by mouth 3 (three) times daily.    . diclofenac sodium (VOLTAREN) 1 % GEL APPLY 4 GRAMS BY TOPICAL ROUTE 4 TIMES A DAY TO THE AFFECTED AREA(S)    . esomeprazole (NEXIUM) 20 MG capsule Take 20 mg by mouth daily at 12 noon.    . fexofenadine (ALLEGRA) 180 MG tablet Take 180 mg by mouth daily.      . fluticasone (FLONASE) 50 MCG/ACT nasal spray USE 2 SPRAYS INTO EACH NOSTRIL DAILY 16 g 3  . gabapentin (NEURONTIN) 100 MG capsule TAKE 1 CAPSULE BY MOUTH TWICE A DAY 60 capsule 5  . hydrochlorothiazide (HYDRODIURIL) 12.5 MG tablet TAKE 1 TABLET BY MOUTH EVERY DAY 30 tablet 5  . losartan (COZAAR) 50 MG tablet TAKE 2 TABLETS BY MOUTH EVERY DAY 60 tablet 2  . pyridOXINE (VITAMIN B-6) 100 MG tablet Take 100 mg by mouth  daily.    . rosuvastatin (CRESTOR) 10 MG tablet TAKE 1/2 TABLET BY MOUTH EVERY DAY 45 tablet 1  . traMADol (ULTRAM) 50 MG tablet Take 50 mg by mouth as needed.     . TURMERIC PO Take 1 capsule by mouth daily.    . vitamin B-12 (CYANOCOBALAMIN) 1000 MCG tablet Take 1,000 mcg by mouth daily.     No facility-administered medications prior to visit.    Allergies  Allergen Reactions  . Amoxicillin-Pot Clavulanate Diarrhea  . Atorvastatin     REACTION: myalgia  . Cephalexin Nausea Only and Nausea And Vomiting  . Oxycodone Hcl Nausea Only    ROS Review of Systems  Neurological: Negative for weakness and numbness.      Objective:    Physical Exam Vitals reviewed.  Constitutional:      Appearance: Normal appearance.  Cardiovascular:     Rate and Rhythm: Normal rate and regular rhythm.  Musculoskeletal:     Comments: Right knee full range of motion.  Minimal if any effusion.  Does have some medial joint line tenderness.  No lateral tenderness.  Ligament testing is normal.  Neurological:     Mental Status: He is alert.     BP (!) 144/84 (BP Location: Left Arm, Patient Position: Sitting, Cuff Size: Normal)   Pulse 81   Temp 97.8 F (36.6 C) (Oral)   Wt 293 lb 14.4 oz (133.3 kg)   SpO2 96%   BMI 40.99 kg/m  Wt Readings from Last 3 Encounters:  03/24/20 293 lb 14.4 oz (133.3 kg)  06/04/19 285 lb 4.8 oz (129.4 kg)  03/28/19 283 lb (128.4 kg)     Health Maintenance Due  Topic Date Due  . COVID-19 Vaccine (1) Never done  . HIV Screening  Never done  . INFLUENZA VACCINE  03/23/2020    There are no preventive care reminders to display for this patient.  Lab Results  Component Value Date   TSH 1.00 06/04/2019   Lab Results  Component Value Date   WBC 5.2 06/04/2019   HGB 16.2 06/04/2019   HCT 48.4 06/04/2019   MCV 85.2 06/04/2019   PLT 164.0 06/04/2019   Lab Results  Component Value Date   NA 142 06/04/2019   K 4.3 06/04/2019   CO2 31 06/04/2019   GLUCOSE  109 (H) 06/04/2019  BUN 18 06/04/2019   CREATININE 0.97 06/04/2019   BILITOT 0.5 06/04/2019   ALKPHOS 78 06/04/2019   AST 15 06/04/2019   ALT 20 06/04/2019   PROT 6.6 06/04/2019   ALBUMIN 4.3 06/04/2019   CALCIUM 9.2 06/04/2019   GFR 79.15 06/04/2019   Lab Results  Component Value Date   CHOL 102 06/04/2019   Lab Results  Component Value Date   HDL 38.80 (L) 06/04/2019   Lab Results  Component Value Date   LDLCALC 48 06/04/2019   Lab Results  Component Value Date   TRIG 76.0 06/04/2019   Lab Results  Component Value Date   CHOLHDL 3 06/04/2019   Lab Results  Component Value Date   HGBA1C 6.0 03/14/2019      Assessment & Plan:   Acute pain right knee.  Suspect possible medial meniscus tear  -Avoid squatting -Continue icing and topical diclofenac -Continue elastic knee sleeve for additional support and compression -Be in touch in 2 to 3 weeks if not improving.  If not improved in 3 weeks consider sports medicine referral  No orders of the defined types were placed in this encounter.   Follow-up: No follow-ups on file.    Carolann Littler, MD

## 2020-03-24 NOTE — Patient Instructions (Signed)
You may have a medial meniscus tear  Continue icing 20-30 minutes 2-3 times daily  Continue topical Diclofenac gel  Avoid squatting  If not improving in 2-3 weeks let me know and we can set up sports medicine referral.

## 2020-03-31 ENCOUNTER — Encounter: Payer: Self-pay | Admitting: Gastroenterology

## 2020-04-07 ENCOUNTER — Telehealth: Payer: Self-pay | Admitting: Family Medicine

## 2020-04-07 DIAGNOSIS — M25561 Pain in right knee: Secondary | ICD-10-CM

## 2020-04-07 NOTE — Telephone Encounter (Signed)
Pt is calling stating that the pain in his R knee is not any better and would like to know what he should do from here.    Pt would like to have a call back.

## 2020-04-08 NOTE — Telephone Encounter (Signed)
Poke with patient. Patient reports he has done everything he was advised to do and still having quite a bit of pain. Patient is in agreement to sports medicine referral. Referral placed as recommendation from last OV note.

## 2020-04-08 NOTE — Telephone Encounter (Signed)
Thanks.  Agree with sports medicine referral.

## 2020-04-14 ENCOUNTER — Other Ambulatory Visit: Payer: Self-pay

## 2020-04-14 ENCOUNTER — Ambulatory Visit: Payer: Self-pay

## 2020-04-14 ENCOUNTER — Ambulatory Visit: Payer: 59 | Admitting: Family Medicine

## 2020-04-14 ENCOUNTER — Ambulatory Visit (INDEPENDENT_AMBULATORY_CARE_PROVIDER_SITE_OTHER)
Admission: RE | Admit: 2020-04-14 | Discharge: 2020-04-14 | Disposition: A | Discharge status: Home / Self Care | Payer: 59 | Source: Ambulatory Visit | Attending: Family Medicine | Admitting: Family Medicine

## 2020-04-14 ENCOUNTER — Encounter: Payer: Self-pay | Admitting: Family Medicine

## 2020-04-14 VITALS — BP 138/80 | HR 76 | Ht 71.0 in | Wt 291.8 lb

## 2020-04-14 DIAGNOSIS — M25561 Pain in right knee: Secondary | ICD-10-CM

## 2020-04-14 NOTE — Progress Notes (Signed)
Subjective:    I'm seeing this patient as a consultation for:  Dr. Elease Hashimoto. Note will be routed back to referring provider/PCP.  CC: R knee pain  I, Paul [name], LAT, ATC, am serving as scribe for Dr. Lynne Leader.  HPI: Pt is a 59 y/o male presenting w/ c/o R knee pain after twisting his R knee in late July 2021 when he slipped while trying to hold a ladder.  His R leg slipped behind him and collapsed medially.He locates his pain to his R medial knee.  He has a hx of R knee menisectomy in 2006 and has been having pain in it for the last 2-3 years.  R knee swelling: yes R knee mechanical symptoms: yes intermittently Aggravating factors: descending stairs; walking downhill; loaded knee flexion Treatments tried: Voltaren gel; ice; knee brace; Advil  Past medical history, Surgical history, Family history, Social history, Allergies, and medications have been entered into the medical record, reviewed.   Review of Systems: No new headache, visual changes, nausea, vomiting, diarrhea, constipation, dizziness, abdominal pain, skin rash, fevers, chills, night sweats, weight loss, swollen lymph nodes, body aches, joint swelling, muscle aches, chest pain, shortness of breath, mood changes, visual or auditory hallucinations.   Objective:    Vitals:   04/14/20 1021  BP: 138/80  Pulse: 76  SpO2: 97%   General: Well Developed, well nourished, and in no acute distress.  Neuro/Psych: Alert and oriented x3, extra-ocular muscles intact, able to move all 4 extremities, sensation grossly intact. Skin: Warm and dry, no rashes noted.  Respiratory: Not using accessory muscles, speaking in full sentences, trachea midline.  Cardiovascular: Pulses palpable, no extremity edema. Abdomen: Does not appear distended. MSK: Right knee moderate effusion normal-appearing otherwise. Tender palpation medial joint line. Range of motion full without significant crepitation. Stable ligamentous exam without  pain. Mildly positive medial McMurray's test. Intact strength.  Lab and Radiology Results \ X-ray images right knee obtained today personally and independently reviewed Moderate to severe medial compartment DJD.  Mild to moderate patellofemoral DJD. Await formal radiology review  Diagnostic Limited MSK Ultrasound of: Right knee Quad tendon intact normal-appearing Moderate joint effusion superior patellar space. Lateral joint line normal-appearing joint space and meniscus. Medial joint line absent medial meniscus and significantly narrowed and degenerative appearing joint line. Posterior knee no Baker's cyst Impression: Medial compartment DJD and joint effusion  Procedure: Real-time Ultrasound Guided Injection of right knee superior patellar space laterally  device: Philips Affiniti 50G Images permanently stored and available for review in PACS Verbal informed consent obtained.  Discussed risks and benefits of procedure. Warned about infection bleeding damage to structures skin hypopigmentation and fat atrophy among others. Patient expresses understanding and agreement Time-out conducted.   Noted no overlying erythema, induration, or other signs of local infection.   Skin prepped in a sterile fashion.   Local anesthesia: Topical Ethyl chloride.   With sterile technique and under real time ultrasound guidance:  40 mg of Kenalog and 2 mL of Marcaine injected easily.   Completed without difficulty   Pain immediately resolved suggesting accurate placement of the medication.   Advised to call if fevers/chills, erythema, induration, drainage, or persistent bleeding.   Images permanently stored and available for review in the ultrasound unit.  Impression: Technically successful ultrasound guided injection.        Impression and Recommendations:    Assessment and Plan: 59 y.o. male with right knee pain 6 weeks after injury.  Likely exacerbation of DJD.  Plan  for injection today.   Continue Voltaren gel and hinged knee brace.  If not better patient will notify clinic will proceed likely with hyaluronic acid injection..   Orders Placed This Encounter  Procedures  . [place] LIMITED JOINT SPACE STRUCTURES LOW RIGHT(NO LINKED CHARGES)    Order Specific Question:   Reason for Exam (SYMPTOM  OR DIAGNOSIS REQUIRED)    Answer:   R knee pain    Order Specific Question:   Preferred imaging location?    Answer:   [facility]  . DG Knee AP/LAT W/Sunrise Right    Standing Status:   Future    Standing Expiration Date:   04/14/2021    Order Specific Question:   Reason for Exam (SYMPTOM  OR DIAGNOSIS REQUIRED)    Answer:   eval knee pain    Order Specific Question:   Preferred imaging location?    Answer:   [facility]    Order Specific Question:   Radiology Contrast Protocol - do NOT remove file path    Answer:   \\[email or web address]   No orders of the defined types were placed in this encounter.   Discussed warning signs or symptoms. Please see discharge instructions. Patient expresses understanding.   The above documentation has been reviewed and is accurate and complete Lynne Leader, M.D.

## 2020-04-14 NOTE — Patient Instructions (Signed)
Thank you for coming in today.  Plan for injection today .  Get xray on the way out.  Continue knee brace and voltaren gel.  If not good enough let me know and we can do gel shots.

## 2020-04-14 NOTE — Progress Notes (Signed)
X-ray right knee shows arthritis throughout the knee.

## 2020-04-17 ENCOUNTER — Other Ambulatory Visit: Payer: Self-pay | Admitting: Family Medicine

## 2020-05-08 ENCOUNTER — Other Ambulatory Visit: Payer: Self-pay | Admitting: Family Medicine

## 2020-05-08 ENCOUNTER — Telehealth: Payer: Self-pay

## 2020-05-08 NOTE — Telephone Encounter (Signed)
Patient called back in regard to Right knee and states the cortisone shot he got 3 weeks ago wore off and is interested in the Gel injection.   Am I okay to go ahead and run patient for the Gelsyn injection for R knee?

## 2020-05-08 NOTE — Telephone Encounter (Signed)
Okay to authorize gel shots.

## 2020-05-09 NOTE — Telephone Encounter (Signed)
Ran for [employer]

## 2020-05-13 ENCOUNTER — Ambulatory Visit (INDEPENDENT_AMBULATORY_CARE_PROVIDER_SITE_OTHER): Payer: 59 | Admitting: Family Medicine

## 2020-05-13 ENCOUNTER — Encounter: Payer: Self-pay | Admitting: Gastroenterology

## 2020-05-13 ENCOUNTER — Other Ambulatory Visit: Payer: Self-pay

## 2020-05-13 ENCOUNTER — Ambulatory Visit (AMBULATORY_SURGERY_CENTER): Payer: Self-pay

## 2020-05-13 ENCOUNTER — Ambulatory Visit: Payer: Self-pay

## 2020-05-13 VITALS — Ht 71.0 in | Wt 296.0 lb

## 2020-05-13 DIAGNOSIS — G8929 Other chronic pain: Secondary | ICD-10-CM

## 2020-05-13 DIAGNOSIS — M1711 Unilateral primary osteoarthritis, right knee: Secondary | ICD-10-CM

## 2020-05-13 DIAGNOSIS — M25561 Pain in right knee: Secondary | ICD-10-CM

## 2020-05-13 DIAGNOSIS — Z1211 Encounter for screening for malignant neoplasm of colon: Secondary | ICD-10-CM

## 2020-05-13 MED ORDER — SUTAB 1479-225-188 MG PO TABS: 1.0000 | ORAL_TABLET | ORAL | 0 refills | Status: DC

## 2020-05-13 NOTE — Progress Notes (Signed)
Paul [name] presents to clinic today for Gelsyn injection right knee 1/3  Procedure: Real-time Ultrasound Guided Injection of right knee lateral superior patellar space Device: Philips Affiniti 50G Images permanently stored and available for review in PACS Verbal informed consent obtained.  Discussed risks and benefits of procedure. Warned about infection bleeding damage to structures skin hypopigmentation and fat atrophy among others. Patient expresses understanding and agreement Time-out conducted.   Noted no overlying erythema, induration, or other signs of local infection.   Skin prepped in a sterile fashion.   Local anesthesia: Topical Ethyl chloride.   With sterile technique and under real time ultrasound guidance:  Gelsyn injected into lateral superior joint capsule. Fluid seen entering the joint capsule.   Completed without difficulty    Advised to call if fevers/chills, erythema, induration, drainage, or persistent bleeding.   Images permanently stored and available for review in the ultrasound unit.  Impression: Technically successful ultrasound guided injection.  Lot #[id number]  Return 1 week for Gelsyn injection right knee 2/3   Of note patient mentioned that he recently had prostate biopsy and one of the biopsies was positive for prostate cancer.  He has follow-up scheduled with Dr. Diona Fanti in the near future to further evaluate this.  He was told that this should be well treatable by his urologist.

## 2020-05-13 NOTE — Patient Instructions (Addendum)
You had a R knee Gelsyn injection today.  Call or go to the ER if you develop a large red swollen joint with extreme pain or oozing puss.   Call or go to the ER if you develop a large red swollen joint with extreme pain or oozing puss.   Schedule next 2 shots weekly.

## 2020-05-13 NOTE — Progress Notes (Signed)
No egg or soy allergy known to patient  No issues with past sedation with any surgeries or procedures No intubation problems in the past  No FH of Malignant Hyperthermia No diet pills per patient No home 02 use per patient  No blood thinners per patient  Pt denies issues with constipation  No A fib or A flutter  EMMI video via MyChart  COVID 19 guidelines implemented in PV today with Pt and RN  Coupon given to pt in PV today , Code to Pharmacy  COVID vaccines completed on 11/2019 per pt;  Due to the COVID-19 pandemic we are asking patients to follow these guidelines. Please only bring one care partner. Please be aware that your care partner may wait in the car in the parking lot or if they feel like they will be too hot to wait in the car, they may wait in the lobby on the 4th floor. All care partners are required to wear a mask the entire time (we do not have any that we can provide them), they need to practice social distancing, and we will do a Covid check for all patient's and care partners when you arrive. Also we will check their temperature and your temperature. If the care partner waits in their car they need to stay in the parking lot the entire time and we will call them on their cell phone when the patient is ready for discharge so they can bring the car to the front of the building. Also all patient's will need to wear a mask into building.  

## 2020-05-19 ENCOUNTER — Ambulatory Visit (INDEPENDENT_AMBULATORY_CARE_PROVIDER_SITE_OTHER): Payer: 59 | Admitting: Family Medicine

## 2020-05-19 ENCOUNTER — Other Ambulatory Visit: Payer: Self-pay

## 2020-05-19 ENCOUNTER — Ambulatory Visit: Payer: Self-pay

## 2020-05-19 DIAGNOSIS — M1711 Unilateral primary osteoarthritis, right knee: Secondary | ICD-10-CM | POA: Diagnosis not present

## 2020-05-19 DIAGNOSIS — M25561 Pain in right knee: Secondary | ICD-10-CM

## 2020-05-19 DIAGNOSIS — G8929 Other chronic pain: Secondary | ICD-10-CM | POA: Diagnosis not present

## 2020-05-19 NOTE — Progress Notes (Signed)
Paul [name] presents to clinic today for right knee Gelsyn injection 2/3  Procedure: Real-time Ultrasound Guided Injection of right knee lateral superior patellar space Device: Philips Affiniti 50G Images permanently stored and available for review in PACS Verbal informed consent obtained.  Discussed risks and benefits of procedure. Warned about infection bleeding damage to structures skin hypopigmentation and fat atrophy among others. Patient expresses understanding and agreement Time-out conducted.   Noted no overlying erythema, induration, or other signs of local infection.   Skin prepped in a sterile fashion.   Local anesthesia: Topical Ethyl chloride.   With sterile technique and under real time ultrasound guidance:  Gelsyn injected into joint. Fluid seen entering the joint capsule.   Completed without difficulty    Advised to call if fevers/chills, erythema, induration, drainage, or persistent bleeding.   Images permanently stored and available for review in the ultrasound unit.  Impression: Technically successful ultrasound guided injection.  Lot #[id number]  Return in 1 week for Gelsyn injection 3/3

## 2020-05-19 NOTE — Patient Instructions (Signed)
You had a R knee injection today.  Call or go to the ER if you develop a large red swollen joint with extreme pain or oozing puss.

## 2020-05-20 ENCOUNTER — Ambulatory Visit: Payer: 59 | Admitting: Family Medicine

## 2020-05-26 ENCOUNTER — Other Ambulatory Visit: Payer: Self-pay

## 2020-05-26 ENCOUNTER — Encounter: Payer: Self-pay | Admitting: Certified Registered Nurse Anesthetist

## 2020-05-26 ENCOUNTER — Ambulatory Visit (INDEPENDENT_AMBULATORY_CARE_PROVIDER_SITE_OTHER): Payer: 59 | Admitting: Family Medicine

## 2020-05-26 ENCOUNTER — Ambulatory Visit: Payer: Self-pay

## 2020-05-26 DIAGNOSIS — M1711 Unilateral primary osteoarthritis, right knee: Secondary | ICD-10-CM | POA: Diagnosis not present

## 2020-05-26 DIAGNOSIS — M25561 Pain in right knee: Secondary | ICD-10-CM | POA: Diagnosis not present

## 2020-05-26 DIAGNOSIS — G8929 Other chronic pain: Secondary | ICD-10-CM | POA: Diagnosis not present

## 2020-05-26 NOTE — Patient Instructions (Signed)
Thank you for coming in today.   Call or go to the ER if you develop a large red swollen joint with extreme pain or oozing puss.    Recheck as needed.    

## 2020-05-26 NOTE — Progress Notes (Signed)
Paul [name] presents to clinic today for right knee Gelsyn injection 3/3  Procedure: Real-time Ultrasound Guided Injection of right knee lateral superior patellar space Device: Philips Affiniti 50G Images permanently stored and available for review in PACS Verbal informed consent obtained.  Discussed risks and benefits of procedure. Warned about infection bleeding damage to structures skin hypopigmentation and fat atrophy among others. Patient expresses understanding and agreement Time-out conducted.   Noted no overlying erythema, induration, or other signs of local infection.   Skin prepped in a sterile fashion.   Local anesthesia: Topical Ethyl chloride.   With sterile technique and under real time ultrasound guidance:  Gelsyn injected into joint. Fluid seen entering the joint capsule.   Completed without difficulty    Advised to call if fevers/chills, erythema, induration, drainage, or persistent bleeding.   Images permanently stored and available for review in the ultrasound unit.  Impression: Technically successful ultrasound guided injection.   Lot number [id number]  Recheck as needed Lanny Hurst course completed

## 2020-05-27 ENCOUNTER — Ambulatory Visit (AMBULATORY_SURGERY_CENTER): Payer: 59 | Admitting: Gastroenterology

## 2020-05-27 ENCOUNTER — Other Ambulatory Visit: Payer: Self-pay

## 2020-05-27 ENCOUNTER — Encounter: Payer: Self-pay | Admitting: Gastroenterology

## 2020-05-27 VITALS — BP 107/61 | HR 73 | Temp 98.0°F | Resp 15 | Ht 71.0 in | Wt 296.0 lb

## 2020-05-27 DIAGNOSIS — D122 Benign neoplasm of ascending colon: Secondary | ICD-10-CM | POA: Diagnosis not present

## 2020-05-27 DIAGNOSIS — Z1211 Encounter for screening for malignant neoplasm of colon: Secondary | ICD-10-CM

## 2020-05-27 DIAGNOSIS — D128 Benign neoplasm of rectum: Secondary | ICD-10-CM

## 2020-05-27 DIAGNOSIS — D125 Benign neoplasm of sigmoid colon: Secondary | ICD-10-CM

## 2020-05-27 DIAGNOSIS — D120 Benign neoplasm of cecum: Secondary | ICD-10-CM | POA: Diagnosis not present

## 2020-05-27 MED ORDER — SODIUM CHLORIDE 0.9 % IV SOLN: 500.0000 mL | Freq: Once | INTRAVENOUS | Status: DC

## 2020-05-27 NOTE — Progress Notes (Signed)
Called to room to assist during endoscopic procedure.  Patient ID and intended procedure confirmed with present staff. Received instructions for my participation in the procedure from the performing physician.  

## 2020-05-27 NOTE — Progress Notes (Signed)
Report given to PACU, vss 

## 2020-05-27 NOTE — Op Note (Signed)
[name] Patient Name: Paul [name] Procedure Date: 05/27/2020 10:15 AM MRN: [id number] Endoscopist: Justice Britain , MD Age: 59 Referring MD:  Date of Birth: 1961/02/03 Gender: Male Account #: [id number] Procedure:                Colonoscopy Indications:              Screening for colorectal malignant neoplasm Medicines:                Monitored Anesthesia Care Procedure:                Pre-Anesthesia Assessment:                           - Prior to the procedure, a History and Physical                            was performed, and patient medications and                            allergies were reviewed. The patient's tolerance of                            previous anesthesia was also reviewed. The risks                            and benefits of the procedure and the sedation                            options and risks were discussed with the patient.                            All questions were answered, and informed consent                            was obtained. Prior Anticoagulants: The patient has                            taken no previous anticoagulant or antiplatelet                            agents except for aspirin. ASA Grade Assessment: II                            - A patient with mild systemic disease. After                            reviewing the risks and benefits, the patient was                            deemed in satisfactory condition to undergo the                            procedure.  After obtaining informed consent, the colonoscope                            was passed under direct vision. Throughout the                            procedure, the patient's blood pressure, pulse, and                            oxygen saturations were monitored continuously. The                            Colonoscope was introduced through the anus and                            advanced to the 5 cm into the ileum. The                             colonoscopy was performed without difficulty. The                            patient tolerated the procedure. The quality of the                            bowel preparation was adequate. The terminal ileum,                            ileocecal valve, appendiceal orifice, and rectum                            were photographed. Scope In: 10:28:48 AM Scope Out: 10:48:20 AM Scope Withdrawal Time: 0 hours 16 minutes 6 seconds  Total Procedure Duration: 0 hours 19 minutes 32 seconds  Findings:                 The digital rectal exam findings include                            hemorrhoids. Pertinent negatives include no                            palpable rectal lesions.                           The terminal ileum and ileocecal valve appeared                            normal.                           Five sessile polyps were found in the rectum (1),                            sigmoid colon (1), ascending colon (2) and cecum                            (  1). The polyps were 2 to 6 mm in size. These                            polyps were removed with a cold snare. Resection                            and retrieval were complete.                           Normal mucosa was found in the entire colon                            otherwise.                           Non-bleeding non-thrombosed internal hemorrhoids                            were found during retroflexion, during perianal                            exam and during digital exam. The hemorrhoids were                            Grade II (internal hemorrhoids that prolapse but                            reduce spontaneously). Complications:            No immediate complications. Estimated Blood Loss:     Estimated blood loss was minimal. Impression:               - Hemorrhoids found on digital rectal exam.                           - The examined portion of the ileum was normal.                           - Five, 2 to  6 mm polyps in the rectum, in the                            sigmoid colon, in the ascending colon and in the                            cecum, removed with a cold snare. Resected and                            retrieved.                           - Normal mucosa in the entire examined colon                            otherwise.                           -  Non-bleeding non-thrombosed internal hemorrhoids. Recommendation:           - The patient will be observed post-procedure,                            until all discharge criteria are met.                           - Discharge patient to home.                           - Patient has a contact number available for                            emergencies. The signs and symptoms of potential                            delayed complications were discussed with the                            patient. Return to normal activities tomorrow.                            Written discharge instructions were provided to the                            patient.                           - High fiber diet.                           - Use FiberCon 1-2 tablets PO daily.                           - Continue present medications.                           - Await pathology results.                           - Repeat colonoscopy in 3/5 years for surveillance                            based on pathology results.                           - The findings and recommendations were discussed                            with the patient. Justice Britain, MD 05/27/2020 10:55:49 AM

## 2020-05-27 NOTE — Patient Instructions (Signed)
Please read handouts provided. ?Continue present medications. ?Await pathology results. ?High Fiber Diet. ?Use FiberCon 1-2 tablets daily. ? ? ?YOU HAD AN ENDOSCOPIC PROCEDURE TODAY AT THE [facility]:   Refer to the procedure report that was given to you for any specific questions about what was found during the examination.  If the procedure report does not answer your questions, please call your gastroenterologist to clarify.  If you requested that your care partner not be given the details of your procedure findings, then the procedure report has been included in a sealed envelope for you to review at your convenience later. ? ?YOU SHOULD EXPECT: Some feelings of bloating in the abdomen. Passage of more gas than usual.  Walking can help get rid of the air that was put into your GI tract during the procedure and reduce the bloating. If you had a lower endoscopy (such as a colonoscopy or flexible sigmoidoscopy) you may notice spotting of blood in your stool or on the toilet paper. If you underwent a bowel prep for your procedure, you may not have a normal bowel movement for a few days. ? ?Please Note:  You might notice some irritation and congestion in your nose or some drainage.  This is from the oxygen used during your procedure.  There is no need for concern and it should clear up in a day or so. ? ?SYMPTOMS TO REPORT IMMEDIATELY: ? ?Following lower endoscopy (colonoscopy or flexible sigmoidoscopy): ? Excessive amounts of blood in the stool ? Significant tenderness or worsening of abdominal pains ? Swelling of the abdomen that is new, acute ? Fever of 100?F or higher ? ? ?For urgent or emergent issues, a gastroenterologist can be reached at any hour by calling [phone]. ?Do not use MyChart messaging for urgent concerns.  ? ? ?DIET:  We do recommend a small meal at first, but then you may proceed to your regular diet.  Drink plenty of fluids but you should avoid alcoholic beverages for 24  hours. ? ?ACTIVITY:  You should plan to take it easy for the rest of today and you should NOT DRIVE or use heavy machinery until tomorrow (because of the sedation medicines used during the test).   ? ?FOLLOW UP: ?Our staff will call the number listed on your records 48-72 hours following your procedure to check on you and address any questions or concerns that you may have regarding the information given to you following your procedure. If we do not reach you, we will leave a message.  We will attempt to reach you two times.  During this call, we will ask if you have developed any symptoms of COVID 19. If you develop any symptoms (ie: fever, flu-like symptoms, shortness of breath, cough etc.) before then, please call [phone].  If you test positive for Covid 19 in the 2 weeks post procedure, please call and report this information to us.   ? ?If any biopsies were taken you will be contacted by phone or by letter within the next 1-3 weeks.  Please call us at [phone] if you have not heard about the biopsies in 3 weeks.  ? ? ?SIGNATURES/CONFIDENTIALITY: ?You and/or your care partner have signed paperwork which will be entered into your electronic medical record.  These signatures attest to the fact that that the information above on your After Visit Summary has been reviewed and is understood.  Full responsibility of the confidentiality of this discharge information lies with you and/or your   care-partner.  ?

## 2020-05-27 NOTE — Progress Notes (Signed)
Pt's states no medical or surgical changes since previsit or office visit.  CW - vitals 

## 2020-05-29 ENCOUNTER — Telehealth: Payer: Self-pay

## 2020-05-29 NOTE — Telephone Encounter (Signed)
Covid-19 screening questions   Do you now or have you had a fever in the last 14 days? No.  Do you have any respiratory symptoms of shortness of breath or cough now or in the last 14 days? No.  Do you have any family members or close contacts with diagnosed or suspected Covid-19 in the past 14 days? No.  Have you been tested for Covid-19 and found to be positive? No.       Follow up Call-  Call back number 05/27/2020  Post procedure Call Back phone  # [phone]  Permission to leave phone message Yes  Some recent data might be hidden     Patient questions:  Do you have a fever, pain , or abdominal swelling? No. Pain Score  0 *  Have you tolerated food without any problems? Yes.    Have you been able to return to your normal activities? Yes.    Do you have any questions about your discharge instructions: Diet   No. Medications  No. Follow up visit  No.  Do you have questions or concerns about your Care? No.  Actions: * If pain score is 4 or above: No action needed, pain <4.

## 2020-05-31 ENCOUNTER — Encounter: Payer: Self-pay | Admitting: Gastroenterology

## 2020-06-03 ENCOUNTER — Other Ambulatory Visit: Payer: Self-pay

## 2020-06-04 ENCOUNTER — Ambulatory Visit (INDEPENDENT_AMBULATORY_CARE_PROVIDER_SITE_OTHER): Payer: 59 | Admitting: Family Medicine

## 2020-06-04 ENCOUNTER — Encounter: Payer: Self-pay | Admitting: Family Medicine

## 2020-06-04 VITALS — BP 126/84 | HR 72 | Temp 98.4°F | Ht 71.0 in | Wt 296.7 lb

## 2020-06-04 DIAGNOSIS — R7303 Prediabetes: Secondary | ICD-10-CM

## 2020-06-04 DIAGNOSIS — Z0000 Encounter for general adult medical examination without abnormal findings: Secondary | ICD-10-CM

## 2020-06-04 DIAGNOSIS — I10 Essential (primary) hypertension: Secondary | ICD-10-CM

## 2020-06-04 DIAGNOSIS — E782 Mixed hyperlipidemia: Secondary | ICD-10-CM | POA: Diagnosis not present

## 2020-06-04 DIAGNOSIS — C61 Malignant neoplasm of prostate: Secondary | ICD-10-CM

## 2020-06-04 NOTE — Progress Notes (Signed)
Established Patient Office Visit  Subjective:  Patient ID: Paul [name], male    DOB: Jun 13, 1961  Age: 59 y.o. MRN: [id number]  CC:  Chief Complaint  Patient presents with  . Annual Exam    HPI Paul [name] presents for physical exam.  He was just recently diagnosed with prostate cancer.  His PSA was 4.51.  He apparently had positive biopsy in 1 quadrant.  He has follow-up pending with urology.  We will discuss possible treatment options then.  He also had recent colonoscopy and had 5 polyps.  He states 4 were benign.  One was precancerous.  Recommended follow-up in 3 years.  Other medical problems include history of hypertension, dyslipidemia, prediabetes.  Medications reviewed with no changes.  No history of shingles vaccine.  He has had shingles clinically.  Has had Covid vaccine and considering booster soon.  Tetanus up-to-date.  Previous hepatitis C screening negative.  Past Medical History:  Diagnosis Date  . Allergy    seasonal allergies  . Arthritis    on meds  . Cancer ([place])    skin  . Cervicalgia 12/17/2009  . GERD 11/20/2008   on meds  . HYPERLIPIDEMIA 11/20/2008   on meds  . Hypertension    on meds  . OBESITY 01/24/2009  . PERIPHERAL VASCULAR DISEASE 01/24/2009  . PREDIABETES 06/12/2010  . Prostate CA ([place]) 2021   dx 05/13/2020  . RHINITIS 04/03/2009  . SHOULDER PAIN 11/26/2009    Past Surgical History:  Procedure Laterality Date  . BACK SURGERY  02-29-12   lumbar fusion per Dr. Rennis Harding in [facility]   . KNEE SURGERY Right   . SHOULDER SURGERY Left   . WISDOM TOOTH EXTRACTION      Family History  Problem Relation Age of Onset  . Hypertension Mother   . Hyperlipidemia Mother   . Hypertension Father   . Heart disease Maternal Uncle   . Cancer Paternal Grandfather        prostate cancer  . Colon polyps Neg Hx   . Colon cancer Neg Hx   . Esophageal cancer Neg Hx   . Stomach cancer Neg Hx   . Rectal cancer Neg Hx     Social History   Socioeconomic  History  . Marital status: Married    Spouse name: Not on file  . Number of children: Not on file  . Years of education: Not on file  . Highest education level: Not on file  Occupational History  . Not on file  Tobacco Use  . Smoking status: Never Smoker  . Smokeless tobacco: Never Used  Vaping Use  . Vaping Use: Never used  Substance and Sexual Activity  . Alcohol use: Not Currently    Alcohol/week: 1.0 standard drink    Types: 1 Glasses of wine per week  . Drug use: No  . Sexual activity: Yes  Other Topics Concern  . Not on file  Social History Narrative  . Not on file   Social Determinants of Health   Financial Resource Strain:   . Difficulty of Paying Living Expenses: Not on file  Food Insecurity:   . Worried About Charity fundraiser in the Last Year: Not on file  . Ran Out of Food in the Last Year: Not on file  Transportation Needs:   . Lack of Transportation (Medical): Not on file  . Lack of Transportation (Non-Medical): Not on file  Physical Activity:   . Days of Exercise  per Week: Not on file  . Minutes of Exercise per Session: Not on file  Stress:   . Feeling of Stress : Not on file  Social Connections:   . Frequency of Communication with Friends and Family: Not on file  . Frequency of Social Gatherings with Friends and Family: Not on file  . Attends Religious Services: Not on file  . Active Member of Clubs or Organizations: Not on file  . Attends Archivist Meetings: Not on file  . Marital Status: Not on file  Intimate Partner Violence:   . Fear of Current or Ex-Partner: Not on file  . Emotionally Abused: Not on file  . Physically Abused: Not on file  . Sexually Abused: Not on file    Outpatient Medications Prior to Visit  Medication Sig Dispense Refill  . aspirin 81 MG tablet Take 81 mg by mouth daily.      . diclofenac sodium (VOLTAREN) 1 % GEL APPLY 4 GRAMS BY TOPICAL ROUTE 4 TIMES A DAY TO THE AFFECTED AREA(S)    . esomeprazole  (NEXIUM) 20 MG capsule Take 20 mg by mouth daily at 12 noon.    . fexofenadine (ALLEGRA) 180 MG tablet Take 180 mg by mouth daily.      . fluticasone (FLONASE) 50 MCG/ACT nasal spray USE 2 SPRAYS INTO EACH NOSTRIL DAILY 16 g 3  . gabapentin (NEURONTIN) 100 MG capsule TAKE 1 CAPSULE BY MOUTH TWICE A DAY 60 capsule 5  . hydrochlorothiazide (HYDRODIURIL) 12.5 MG tablet TAKE 1 TABLET BY MOUTH EVERY DAY 30 tablet 5  . losartan (COZAAR) 50 MG tablet TAKE 2 TABLETS BY MOUTH EVERY DAY 60 tablet 2  . pyridOXINE (VITAMIN B-6) 100 MG tablet Take 100 mg by mouth daily.    . rosuvastatin (CRESTOR) 10 MG tablet TAKE 1/2 TABLET BY MOUTH EVERY DAY 45 tablet 1  . sildenafil (REVATIO) 20 MG tablet SMARTSIG:2-5 Tablet(s) By Mouth PRN    . vitamin B-12 (CYANOCOBALAMIN) 1000 MCG tablet Take 1,000 mcg by mouth daily.     No facility-administered medications prior to visit.    Allergies  Allergen Reactions  . Amoxicillin-Pot Clavulanate Diarrhea  . Atorvastatin     REACTION: myalgia  . Cephalexin Nausea Only and Nausea And Vomiting  . Oxycodone Hcl Nausea Only    ROS Review of Systems  Constitutional: Negative for activity change, appetite change, fatigue and fever.  HENT: Negative for congestion, ear pain and trouble swallowing.   Eyes: Negative for pain and visual disturbance.  Respiratory: Negative for cough, shortness of breath and wheezing.   Cardiovascular: Negative for chest pain and palpitations.  Gastrointestinal: Negative for abdominal distention, abdominal pain, blood in stool, constipation, diarrhea, nausea, rectal pain and vomiting.  Endocrine: Negative for polydipsia and polyuria.  Genitourinary: Negative for dysuria, hematuria and testicular pain.  Musculoskeletal: Negative for joint swelling.  Skin: Negative for rash.  Neurological: Negative for dizziness, syncope and headaches.  Hematological: Negative for adenopathy.  Psychiatric/Behavioral: Negative for confusion and dysphoric mood.        Objective:    Physical Exam Constitutional:      Appearance: He is well-developed.  HENT:     Right Ear: External ear normal.     Left Ear: External ear normal.  Eyes:     Pupils: Pupils are equal, round, and reactive to light.  Neck:     Thyroid: No thyromegaly.  Cardiovascular:     Rate and Rhythm: Normal rate and regular rhythm.  Pulmonary:  Effort: Pulmonary effort is normal. No respiratory distress.     Breath sounds: Normal breath sounds. No wheezing or rales.  Abdominal:     Palpations: Abdomen is soft.     Tenderness: There is no abdominal tenderness.     Hernia: No hernia is present.  Musculoskeletal:     Cervical back: Neck supple.     Right lower leg: No edema.     Left lower leg: No edema.  Neurological:     Mental Status: He is alert and oriented to person, place, and time.     BP 126/84   Pulse 72   Temp 98.4 F (36.9 C)   Ht 5\' 11"  (1.803 m)   Wt 296 lb 11.2 oz (134.6 kg)   SpO2 91%   BMI 41.38 kg/m  Wt Readings from Last 3 Encounters:  06/04/20 296 lb 11.2 oz (134.6 kg)  05/27/20 296 lb (134.3 kg)  05/13/20 296 lb (134.3 kg)     There are no preventive care reminders to display for this patient.  There are no preventive care reminders to display for this patient.  Lab Results  Component Value Date   TSH 1.00 06/04/2019   Lab Results  Component Value Date   WBC 5.2 06/04/2019   HGB 16.2 06/04/2019   HCT 48.4 06/04/2019   MCV 85.2 06/04/2019   PLT 164.0 06/04/2019   Lab Results  Component Value Date   NA 142 06/04/2019   K 4.3 06/04/2019   CO2 31 06/04/2019   GLUCOSE 109 (H) 06/04/2019   BUN 18 06/04/2019   CREATININE 0.97 06/04/2019   BILITOT 0.5 06/04/2019   ALKPHOS 78 06/04/2019   AST 15 06/04/2019   ALT 20 06/04/2019   PROT 6.6 06/04/2019   ALBUMIN 4.3 06/04/2019   CALCIUM 9.2 06/04/2019   GFR 79.15 06/04/2019   Lab Results  Component Value Date   CHOL 102 06/04/2019   Lab Results  Component Value Date    HDL 38.80 (L) 06/04/2019   Lab Results  Component Value Date   LDLCALC 48 06/04/2019   Lab Results  Component Value Date   TRIG 76.0 06/04/2019   Lab Results  Component Value Date   CHOLHDL 3 06/04/2019   Lab Results  Component Value Date   HGBA1C 6.0 03/14/2019      Assessment & Plan:   Physical exam.  Patient recently diagnosed with prostate cancer.  He has other chronic medical problems as above.  The following health maintenance issues were discussed  -Flu vaccine already given -Discussed Shingrix vaccine and he will check on insurance coverage -Recommend Covid booster soon -Other immunizations up-to-date -Obtain follow-up labs.  Will not check PSA as he is followed by urology for his prostate cancer.  No orders of the defined types were placed in this encounter.   Follow-up: No follow-ups on file.    Carolann Littler, MD

## 2020-06-04 NOTE — Patient Instructions (Addendum)
Preventive Care 41-60 Years Old, Male Preventive care refers to lifestyle choices and visits with your health care provider that can promote health and wellness. This includes:  A yearly physical exam. This is also called an annual well check.  Regular dental and eye exams.  Immunizations.  Screening for certain conditions.  Healthy lifestyle choices, such as eating a healthy diet, getting regular exercise, not using drugs or products that contain nicotine and tobacco, and limiting alcohol use. What can I expect for my preventive care visit? Physical exam Your health care provider will check:  Height and weight. These may be used to calculate body mass index (BMI), which is a measurement that tells if you are at a healthy weight.  Heart rate and blood pressure.  Your skin for abnormal spots. Counseling Your health care provider may ask you questions about:  Alcohol, tobacco, and drug use.  Emotional well-being.  Home and relationship well-being.  Sexual activity.  Eating habits.  Work and work Statistician. What immunizations do I need?  Influenza (flu) vaccine  This is recommended every year. Tetanus, diphtheria, and pertussis (Tdap) vaccine  You may need a Td booster every 10 years. Varicella (chickenpox) vaccine  You may need this vaccine if you have not already been vaccinated. Zoster (shingles) vaccine  You may need this after age 64. Measles, mumps, and rubella (MMR) vaccine  You may need at least one dose of MMR if you were born in 1957 or later. You may also need a second dose. Pneumococcal conjugate (PCV13) vaccine  You may need this if you have certain conditions and were not previously vaccinated. Pneumococcal polysaccharide (PPSV23) vaccine  You may need one or two doses if you smoke cigarettes or if you have certain conditions. Meningococcal conjugate (MenACWY) vaccine  You may need this if you have certain conditions. Hepatitis A  vaccine  You may need this if you have certain conditions or if you travel or work in places where you may be exposed to hepatitis A. Hepatitis B vaccine  You may need this if you have certain conditions or if you travel or work in places where you may be exposed to hepatitis B. Haemophilus influenzae type b (Hib) vaccine  You may need this if you have certain risk factors. Human papillomavirus (HPV) vaccine  If recommended by your health care provider, you may need three doses over 6 months. You may receive vaccines as individual doses or as more than one vaccine together in one shot (combination vaccines). Talk with your health care provider about the risks and benefits of combination vaccines. What tests do I need? Blood tests  Lipid and cholesterol levels. These may be checked every 5 years, or more frequently if you are over 60 years old.  Hepatitis C test.  Hepatitis B test. Screening  Lung cancer screening. You may have this screening every year starting at age 43 if you have a 30-pack-year history of smoking and currently smoke or have quit within the past 15 years.  Prostate cancer screening. Recommendations will vary depending on your family history and other risks.  Colorectal cancer screening. All adults should have this screening starting at age 72 and continuing until age 2. Your health care provider may recommend screening at age 14 if you are at increased risk. You will have tests every 1-10 years, depending on your results and the type of screening test.  Diabetes screening. This is done by checking your blood sugar (glucose) after you have not eaten  for a while (fasting). You may have this done every 1-3 years.  Sexually transmitted disease (STD) testing. Follow these instructions at home: Eating and drinking  Eat a diet that includes fresh fruits and vegetables, whole grains, lean protein, and low-fat dairy products.  Take vitamin and mineral supplements as  recommended by your health care provider.  Do not drink alcohol if your health care provider tells you not to drink.  If you drink alcohol: ? Limit how much you have to 0-2 drinks a day. ? Be aware of how much alcohol is in your drink. In the [place] one drink equals one 12 oz bottle of beer (355 mL), one 5 oz glass of wine (148 mL), or one 1 oz glass of hard liquor (44 mL). Lifestyle  Take daily care of your teeth and gums.  Stay active. Exercise for at least 30 minutes on 5 or more days each week.  Do not use any products that contain nicotine or tobacco, such as cigarettes, e-cigarettes, and chewing tobacco. If you need help quitting, ask your health care provider.  If you are sexually active, practice safe sex. Use a condom or other form of protection to prevent STIs (sexually transmitted infections).  Talk with your health care provider about taking a low-dose aspirin every day starting at age 23. What's next?  Go to your health care provider once a year for a well check visit.  Ask your health care provider how often you should have your eyes and teeth checked.  Stay up to date on all vaccines. This information is not intended to replace advice given to you by your health care provider. Make sure you discuss any questions you have with your health care provider. Document Revised: 08/03/2018 Document Reviewed: 08/03/2018 Elsevier Patient Education  [place].  Consider Shingrix vaccine- but would check with insurance first.     If you have lab work done today you will be contacted with your lab results within the next 2 weeks.  If you have not heard from [place] then please contact us. The fastest way to get your results is to register for My Chart.   IF you received an x-ray today, you will receive an invoice from [facility]. Please contact [facility] at [phone] with questions or concerns regarding your invoice.   IF you received labwork today,  you will receive an invoice from [facility]. Please contact [facility] at [phone] with questions or concerns regarding your invoice.   Our billing staff will not be able to assist you with questions regarding bills from these companies.  You will be contacted with the lab results as soon as they are available. The fastest way to get your results is to activate your My Chart account. Instructions are located on the last page of this paperwork. If you have not heard from [place] regarding the results in 2 weeks, please contact this office.

## 2020-06-05 LAB — COMPLETE METABOLIC PANEL WITH GFR
AG Ratio: 1.8 (calc) (ref 1.0–2.5)
ALT: 22 U/L (ref 9–46)
AST: 15 U/L (ref 10–35)
Albumin: 4.1 g/dL (ref 3.6–5.1)
Alkaline phosphatase (APISO): 81 U/L (ref 35–144)
BUN: 19 mg/dL (ref 7–25)
CO2: 31 mmol/L (ref 20–32)
Calcium: 9.2 mg/dL (ref 8.6–10.3)
Chloride: 106 mmol/L (ref 98–110)
Creat: 0.89 mg/dL (ref 0.70–1.25)
GFR, Est African American: 108 mL/min/{1.73_m2} (ref >=60)
GFR, Est Non African American: 93 mL/min/{1.73_m2} (ref >=60)
Globulin: 2.3 g/dL (calc) (ref 1.9–3.7)
Glucose, Bld: 108 mg/dL — ABNORMAL HIGH (ref 65–99)
Potassium: 4.6 mmol/L (ref 3.5–5.3)
Sodium: 143 mmol/L (ref 135–146)
Total Bilirubin: 0.5 mg/dL (ref 0.2–1.2)
Total Protein: 6.4 g/dL (ref 6.1–8.1)

## 2020-06-05 LAB — CBC WITH DIFFERENTIAL/PLATELET
Absolute Monocytes: 717 cells/uL (ref 200–950)
Basophils Absolute: 50 cells/uL (ref 0–200)
Basophils Relative: 0.9 %
Eosinophils Absolute: 308 cells/uL (ref 15–500)
Eosinophils Relative: 5.5 %
HCT: 47.4 % (ref 38.5–50.0)
Hemoglobin: 15.5 g/dL (ref 13.2–17.1)
Lymphs Abs: 1663 cells/uL (ref 850–3900)
MCH: 28.3 pg (ref 27.0–33.0)
MCHC: 32.7 g/dL (ref 32.0–36.0)
MCV: 86.5 fL (ref 80.0–100.0)
MPV: 12 fL (ref 7.5–12.5)
Monocytes Relative: 12.8 %
Neutro Abs: 2862 cells/uL (ref 1500–7800)
Neutrophils Relative %: 51.1 %
Platelets: 157 10*3/uL (ref 140–400)
RBC: 5.48 10*6/uL (ref 4.20–5.80)
RDW: 13.4 % (ref 11.0–15.0)
Total Lymphocyte: 29.7 %
WBC: 5.6 10*3/uL (ref 3.8–10.8)

## 2020-06-05 LAB — HEMOGLOBIN A1C
Hgb A1c MFr Bld: 6 % of total Hgb — ABNORMAL HIGH (ref <5.7)
Mean Plasma Glucose: 126 (calc)
eAG (mmol/L): 7 (calc)

## 2020-06-05 LAB — LIPID PANEL
Cholesterol: 111 mg/dL (ref <200)
HDL: 39 mg/dL — ABNORMAL LOW (ref >=40)
LDL Cholesterol (Calc): 56 mg/dL (calc)
Non-HDL Cholesterol (Calc): 72 mg/dL (calc) (ref <130)
Total CHOL/HDL Ratio: 2.8 (calc) (ref <5.0)
Triglycerides: 82 mg/dL (ref <150)

## 2020-08-06 ENCOUNTER — Other Ambulatory Visit: Payer: Self-pay | Admitting: Family Medicine

## 2020-08-23 DIAGNOSIS — C61 Malignant neoplasm of prostate: Secondary | ICD-10-CM

## 2020-08-23 HISTORY — DX: Malignant neoplasm of prostate: C61

## 2020-09-25 ENCOUNTER — Other Ambulatory Visit: Payer: Self-pay | Admitting: Family Medicine

## 2020-10-06 ENCOUNTER — Other Ambulatory Visit: Payer: Self-pay | Admitting: Urology

## 2020-10-06 DIAGNOSIS — C61 Malignant neoplasm of prostate: Secondary | ICD-10-CM

## 2020-10-26 ENCOUNTER — Ambulatory Visit
Admission: RE | Admit: 2020-10-26 | Discharge: 2020-10-26 | Disposition: A | Discharge status: Home / Self Care | Payer: 59 | Source: Ambulatory Visit | Attending: Urology | Admitting: Urology

## 2020-10-26 ENCOUNTER — Other Ambulatory Visit: Payer: Self-pay

## 2020-10-26 DIAGNOSIS — C61 Malignant neoplasm of prostate: Secondary | ICD-10-CM

## 2020-10-26 MED ORDER — GADOBENATE DIMEGLUMINE 529 MG/ML IV SOLN
20.0000 mL | Freq: Once | INTRAVENOUS | Status: AC | PRN
  Administered 2020-10-26: 20 mL via INTRAVENOUS

## 2020-11-25 ENCOUNTER — Other Ambulatory Visit: Payer: Self-pay | Admitting: Family Medicine

## 2021-01-02 ENCOUNTER — Telehealth: Payer: Self-pay | Admitting: Family Medicine

## 2021-01-02 IMAGING — DX LEFT THUMB 2+V
3 series · 3 of 3 positions shown · non-contrast
Comparison: None.

CLINICAL DATA: Persistent left thumb pain near the first CMC joint
since injury in April 2018.

EXAM:
LEFT THUMB 2+V

[finger pa]
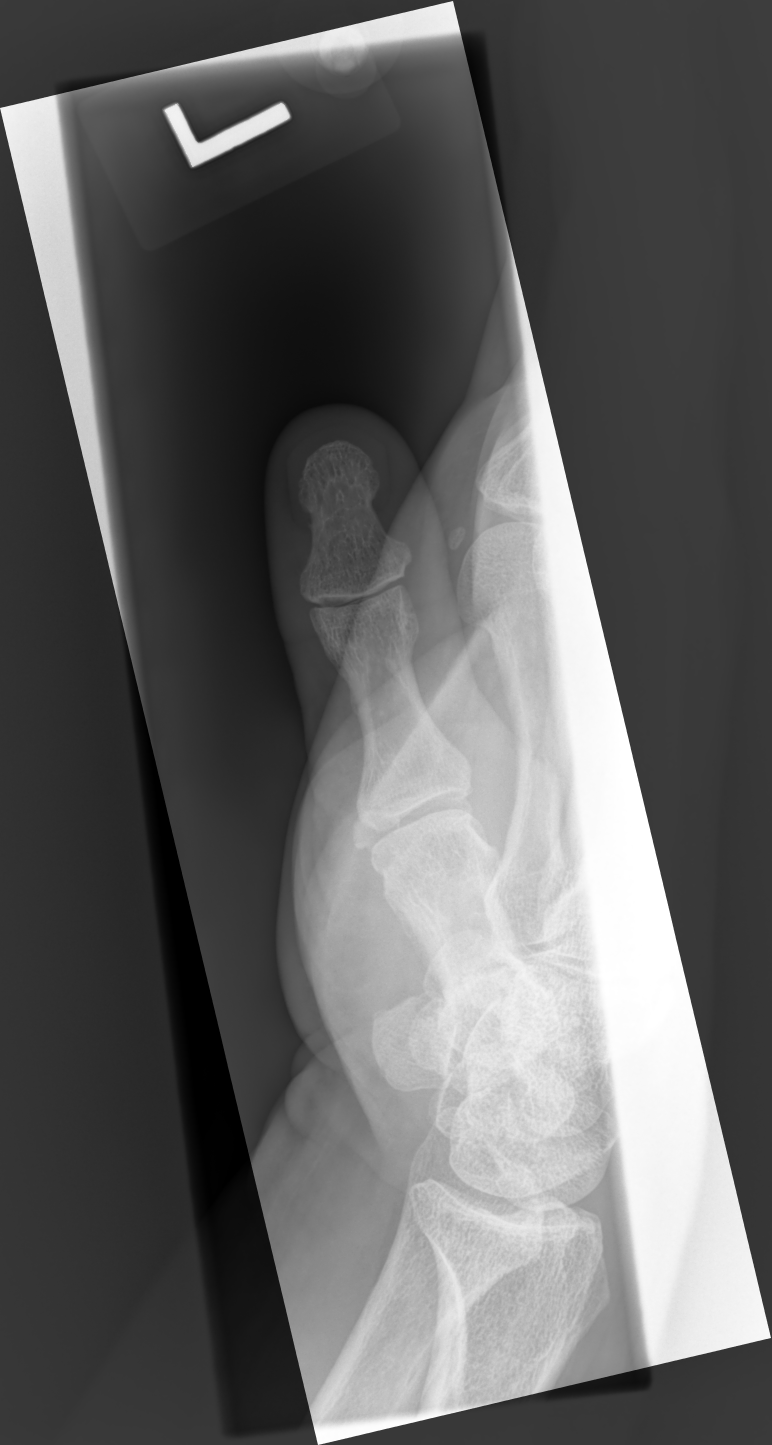

[finger mlo]
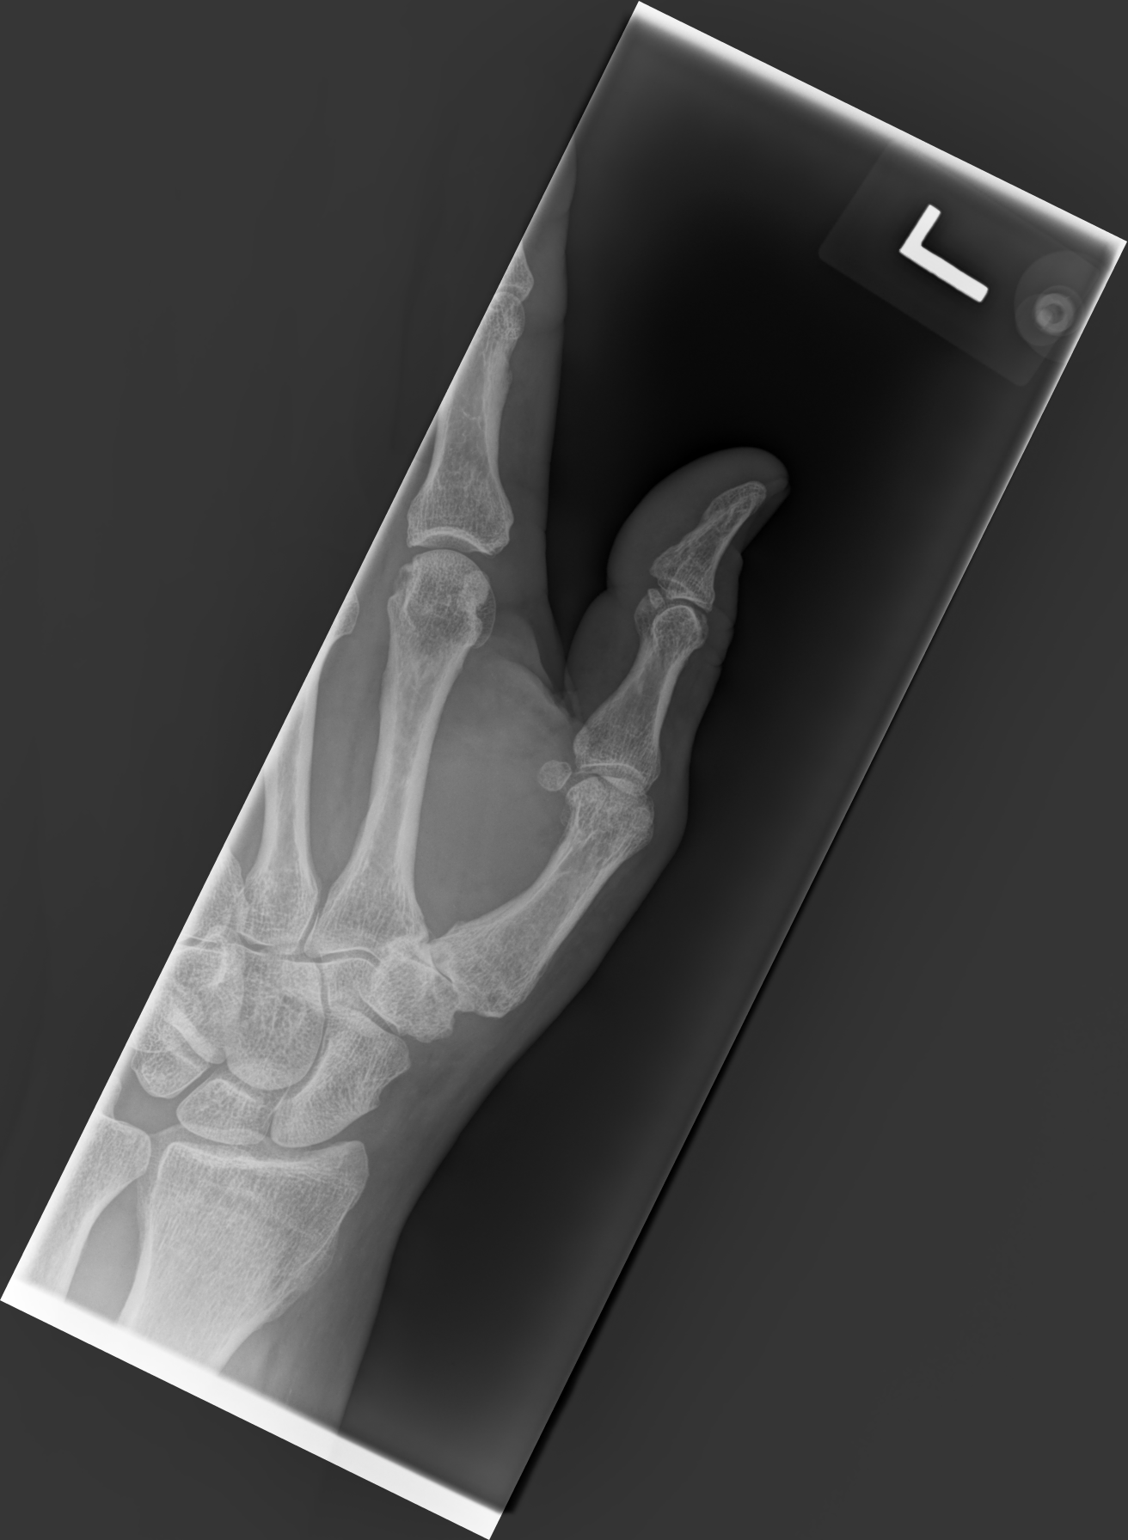

[2. finger lat]
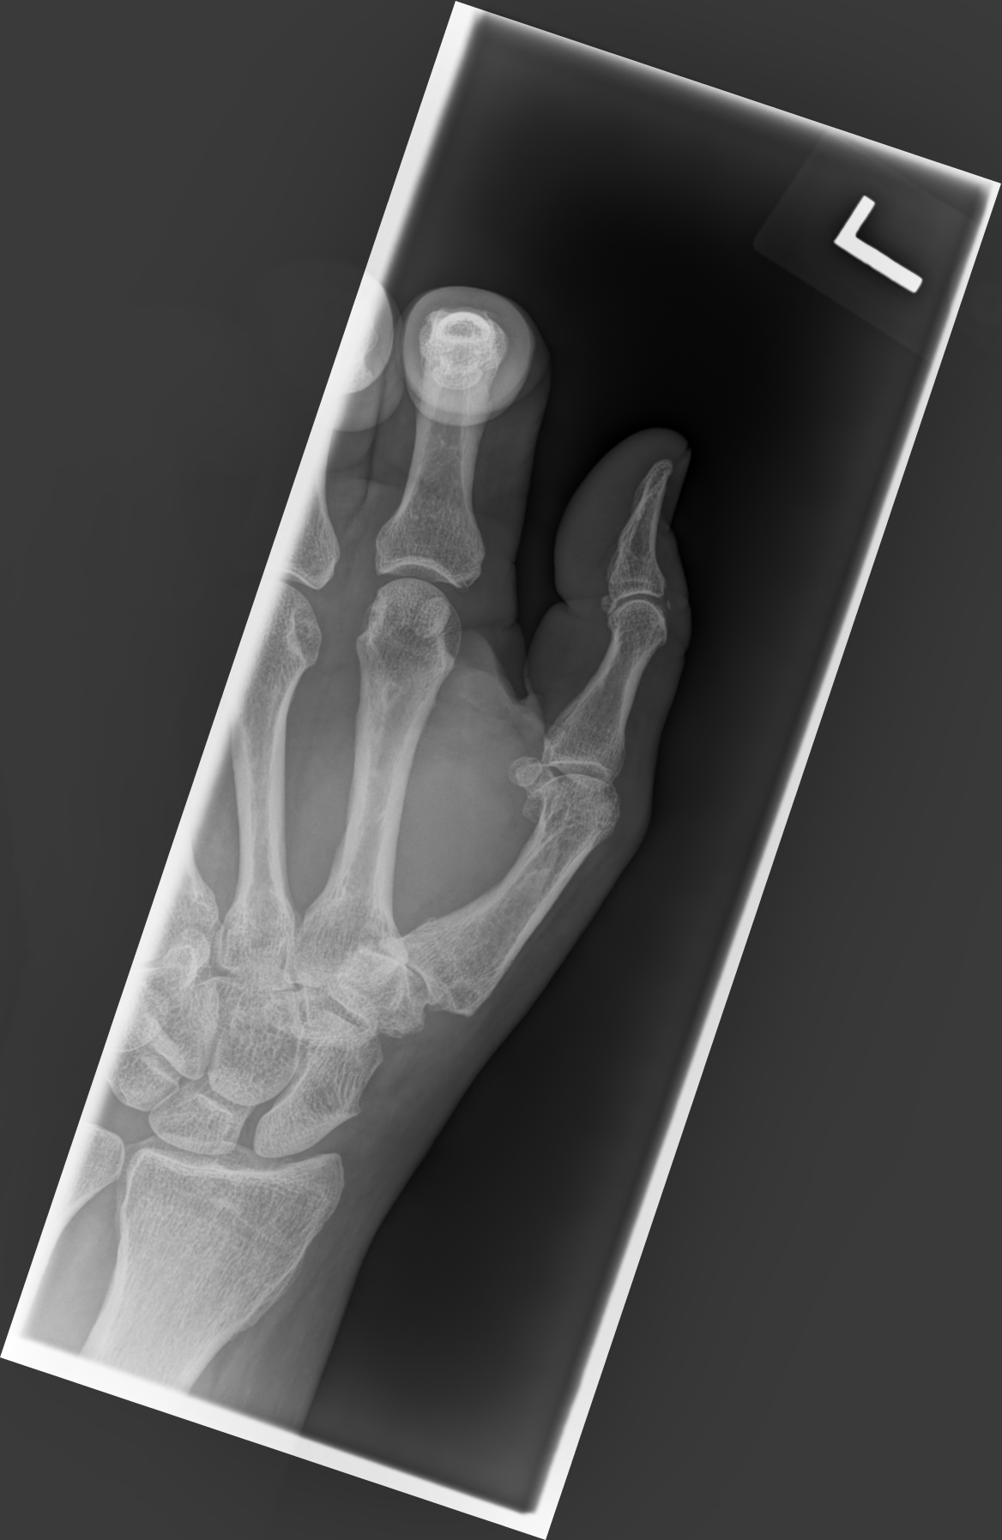

[3 of 3 positions shown; findings below may reference images not displayed]

FINDINGS: No acute fracture or dislocation. Moderate first CMC joint space
narrowing with marginal osteophytes. Remaining joint spaces are
preserved. Bone mineralization is normal. Soft tissues are
unremarkable.
IMPRESSION: 1. Moderate first CMC joint osteoarthritis.

## 2021-01-02 NOTE — Telephone Encounter (Signed)
I have re-run patient for Gelsyn awaiting reply from insurance

## 2021-01-02 NOTE — Telephone Encounter (Signed)
Patient called asking to schedule an appointment for more Gelsyn injections.  I have scheduled him for Wednesday.  Would we be able to run these for him?

## 2021-01-03 ENCOUNTER — Other Ambulatory Visit: Payer: Self-pay | Admitting: Family Medicine

## 2021-01-06 NOTE — Progress Notes (Signed)
I, Wendy Poet, LAT, ATC, am serving as scribe for Dr. Lynne Leader.  Paul [name] is a 60 y.o. male who presents to [place] at [facility] today for f/u of R knee pain and possible injection, Gelsyn or otherwise.  He was last seen by Dr. Georgina Snell on 05/26/20 for his 3rd round of R knee Gelsyn injections. Since then, pt reports he felt the prior gel shot helped for 4-5 months. Pt reports increased pain w/ lateral movements. Pt notes pain returned a few months ago. Pt locates pain to the anterior-medial aspect. Pt works outside a Academic librarian.   Patient also has [facility].  He has left first [place] DJD and it is being evaluated for potential surgery for that and had a right fourth trigger finger injection over the last few months.  Diagnostic testing: R knee XR- 04/14/20  Pertinent review of systems: No fevers or chills  Relevant historical information: Prostate cancer, obesity   Exam:  BP (!) 146/94 (BP Location: Right Arm, Patient Position: Sitting, Cuff Size: Large)   Pulse 78   Ht 5\' 11"  (1.803 m)   Wt 295 lb 6.4 oz (134 kg)   SpO2 97%   BMI 41.20 kg/m  General: Well Developed, well nourished, and in no acute distress.   MSK: Right knee moderate effusion normal motion with crepitation.    Lab and Radiology Results  Gelsyn injection right knee 1/3  Procedure: Real-time Ultrasound Guided Injection of right knee superior lateral patellar space Device: Philips Affiniti 50G Images permanently stored and available for review in PACS Verbal informed consent obtained.  Discussed risks and benefits of procedure. Warned about infection bleeding damage to structures skin hypopigmentation and fat atrophy among others. Patient expresses understanding and agreement Time-out conducted.   Noted no overlying erythema, induration, or other signs of local infection.   Skin prepped in a sterile fashion.   Local anesthesia: Topical Ethyl  chloride.   With sterile technique and under real time ultrasound guidance:  Gelsyn 16.8 mg injected into right knee joint. Fluid seen entering the joint capsule.   Completed without difficulty   Advised to call if fevers/chills, erythema, induration, drainage, or persistent bleeding.   Images permanently stored and available for review in the ultrasound unit.  Impression: Technically successful ultrasound guided injection.  Lot #[id number]   EXAM: RIGHT KNEE 3 VIEWS  COMPARISON:  None.  FINDINGS: No fracture. No subluxation or dislocation. Marked loss of joint space noted in the medial compartment with hypertrophic spurring visible in all 3 compartments. No substantial joint effusion.  IMPRESSION: Tricompartmental degenerative changes.  No acute bony abnormality.   Electronically Signed   By: Misty Stanley M.D.   On: 04/14/2020 16:04   Assessment and Plan: 60 y.o. male with right knee pain due to DJD.  Patient had reasonable success to hyaluronic acid injection at last visit.  Plan to restart hyaluronic acid injection series today.  Return 1 week for Gelsyn injection 2/3.  Advised that ultimately knee will likely require total knee replacement.  Plan to try to get BMI less than 40 so that that is a possibility in the future.  Additionally spent a little bit of time talking about his left first [place] DJD and right fourth trigger finger.   PDMP not reviewed this encounter. Orders Placed This Encounter  Procedures  . [place] LIMITED JOINT SPACE STRUCTURES LOW RIGHT(NO LINKED CHARGES)    Standing Status:   Future  Number of Occurrences:   1    Standing Expiration Date:   07/10/2021    Order Specific Question:   Reason for Exam (SYMPTOM  OR DIAGNOSIS REQUIRED)    Answer:   right knee pain    Order Specific Question:   Preferred imaging location?    Answer:   [place]   No orders of the defined types were placed in this encounter.    Discussed  warning signs or symptoms. Please see discharge instructions. Patient expresses understanding.   The above documentation has been reviewed and is accurate and complete Lynne Leader, M.D.

## 2021-01-07 ENCOUNTER — Other Ambulatory Visit: Payer: Self-pay

## 2021-01-07 ENCOUNTER — Ambulatory Visit: Payer: Self-pay

## 2021-01-07 ENCOUNTER — Ambulatory Visit (INDEPENDENT_AMBULATORY_CARE_PROVIDER_SITE_OTHER): Payer: 59 | Admitting: Family Medicine

## 2021-01-07 VITALS — BP 146/94 | HR 78 | Ht 71.0 in | Wt 295.4 lb

## 2021-01-07 DIAGNOSIS — M1711 Unilateral primary osteoarthritis, right knee: Secondary | ICD-10-CM

## 2021-01-07 DIAGNOSIS — M25561 Pain in right knee: Secondary | ICD-10-CM

## 2021-01-07 DIAGNOSIS — M65341 Trigger finger, right ring finger: Secondary | ICD-10-CM

## 2021-01-07 DIAGNOSIS — Z6841 Body Mass Index (BMI) 40.0 and over, adult: Secondary | ICD-10-CM | POA: Diagnosis not present

## 2021-01-07 DIAGNOSIS — G8929 Other chronic pain: Secondary | ICD-10-CM

## 2021-01-07 NOTE — Patient Instructions (Addendum)
Thank you for coming in today.  You had a gel injection in your knee today. Call or go to the ER if you develop a large red swollen joint with extreme pain or oozing puss.   Schedule your next gel injection for the following week.  Use the double bandaid splint for your trigger finger.  Try to get your BMI under 40.

## 2021-01-14 ENCOUNTER — Ambulatory Visit: Payer: Self-pay

## 2021-01-14 ENCOUNTER — Ambulatory Visit (INDEPENDENT_AMBULATORY_CARE_PROVIDER_SITE_OTHER): Payer: 59 | Admitting: Family Medicine

## 2021-01-14 DIAGNOSIS — G8929 Other chronic pain: Secondary | ICD-10-CM

## 2021-01-14 DIAGNOSIS — M1711 Unilateral primary osteoarthritis, right knee: Secondary | ICD-10-CM

## 2021-01-14 DIAGNOSIS — M25561 Pain in right knee: Secondary | ICD-10-CM

## 2021-01-14 NOTE — Patient Instructions (Signed)
Thank you for coming in today.  Call or go to the ER if you develop a large red swollen joint with extreme pain or oozing puss.   Return in 1 week for the 3rd injection

## 2021-01-14 NOTE — Progress Notes (Signed)
Paul [name] presents to clinic today for Gelsyn injection right knee 2/3  Procedure: Real-time Ultrasound Guided Injection of right knee superior lateral patellar space Device: Philips Affiniti 50G Images permanently stored and available for review in PACS Verbal informed consent obtained.  Discussed risks and benefits of procedure. Warned about infection bleeding damage to structures skin hypopigmentation and fat atrophy among others. Patient expresses understanding and agreement Time-out conducted.   Noted no overlying erythema, induration, or other signs of local infection.   Skin prepped in a sterile fashion.   Local anesthesia: Topical Ethyl chloride.   With sterile technique and under real time ultrasound guidance:  Gelsyn 16.8 mg injected into knee joint. Fluid seen entering the joint capsule.   Completed without difficulty   Pain immediately resolved suggesting accurate placement of the medication.   Advised to call if fevers/chills, erythema, induration, drainage, or persistent bleeding.   Images permanently stored and available for review in the ultrasound unit.  Impression: Technically successful ultrasound guided injection.   Lot #[id number]

## 2021-01-18 ENCOUNTER — Other Ambulatory Visit: Payer: Self-pay | Admitting: Family Medicine

## 2021-01-21 ENCOUNTER — Ambulatory Visit: Payer: Self-pay

## 2021-01-21 ENCOUNTER — Ambulatory Visit (INDEPENDENT_AMBULATORY_CARE_PROVIDER_SITE_OTHER): Payer: 59 | Admitting: Family Medicine

## 2021-01-21 ENCOUNTER — Other Ambulatory Visit: Payer: Self-pay

## 2021-01-21 DIAGNOSIS — G8929 Other chronic pain: Secondary | ICD-10-CM

## 2021-01-21 DIAGNOSIS — M1711 Unilateral primary osteoarthritis, right knee: Secondary | ICD-10-CM | POA: Diagnosis not present

## 2021-01-21 DIAGNOSIS — M25561 Pain in right knee: Secondary | ICD-10-CM

## 2021-01-21 NOTE — Patient Instructions (Addendum)
Good to see you today.  You had your 3rd R knee Gelsyn injection today.  Call or go to the ER if you develop a large red swollen joint with extreme pain or oozing puss.   Follow-up as needed.

## 2021-01-21 NOTE — Progress Notes (Signed)
[place]on presents to clinic today for Gelsyn injection right knee 3/3  Procedure: Real-time Ultrasound Guided Injection of right knee superior lateral patellar space Device: Philips Affiniti 50G Images permanently stored and available for review in PACS Verbal informed consent obtained.  Discussed risks and benefits of procedure. Warned about infection bleeding damage to structures skin hypopigmentation and fat atrophy among others. Patient expresses understanding and agreement Time-out conducted.   Noted no overlying erythema, induration, or other signs of local infection.   Skin prepped in a sterile fashion.   Local anesthesia: Topical Ethyl chloride.   With sterile technique and under real time ultrasound guidance:  Gelsyn 16.8 mg injected into knee joint. Fluid seen entering the joint capsule.   Completed without difficulty   Advised to call if fevers/chills, erythema, induration, drainage, or persistent bleeding.   Images permanently stored and available for review in the ultrasound unit.  Impression: Technically successful ultrasound guided injection.  Lot number: [id number]  Return as needed for knee pain

## 2021-03-10 ENCOUNTER — Other Ambulatory Visit: Payer: Self-pay

## 2021-03-10 DIAGNOSIS — I779 Disorder of arteries and arterioles, unspecified: Secondary | ICD-10-CM

## 2021-03-18 ENCOUNTER — Ambulatory Visit: Payer: 59 | Admitting: Physician Assistant

## 2021-03-18 ENCOUNTER — Other Ambulatory Visit: Payer: Self-pay

## 2021-03-18 ENCOUNTER — Ambulatory Visit (HOSPITAL_COMMUNITY)
Admission: RE | Admit: 2021-03-18 | Discharge: 2021-03-18 | Disposition: A | Discharge status: Home / Self Care | Payer: 59 | Source: Ambulatory Visit | Attending: Vascular Surgery | Admitting: Vascular Surgery

## 2021-03-18 ENCOUNTER — Encounter: Payer: Self-pay | Admitting: Physician Assistant

## 2021-03-18 DIAGNOSIS — I779 Disorder of arteries and arterioles, unspecified: Secondary | ICD-10-CM | POA: Diagnosis present

## 2021-03-18 NOTE — Progress Notes (Signed)
Office Note     CC:  follow up Requesting Provider:  [name], MD  HPI: Paul [name] is a 60 y.o. (1961/04/11) male who presents for follow up of peripheral artery disease.  He has history of claudication in left greater than right lower extremity. When he was seen in 2020 he was doing well with his walking and could walk approximately 200-400 yards before calf would start hurting. This was relieved with rest. He was ambulating frequently when taking his dogs for walks.  Today he reports that he is doing better. He no longer has any claudication symptoms. 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 He remains very active doing a lot of work around his property and taking care of his ageing parents. He denies any rest pain or tissue loss. He also is doing much better in regard to his back. History of multiple back surgeries . He does have neuropathy in left foot that has been present since his last back surgery. He continues to take Gabapentin for this with improvement. He does have arthritis in his right knee and gets joint injections. 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   He reports that he was diagnosed with early stage prostate cancer recently. No other medical changes since he was here last. He does report need for left thumb surgery but again is holding off on surgery at this time because of recommended down time after surgery.   The pt is on a statin for cholesterol management.  The pt is on a daily aspirin.   Other AC:  no The pt is on HCTZ, ARB for hypertension.   The pt is not diabetic.   Tobacco hx:  non smoker  Past Medical History:  Diagnosis Date   Allergy    seasonal allergies   Arthritis    on meds   Cancer ([place])    skin   Cervicalgia 12/17/2009   GERD 11/20/2008   on meds   HYPERLIPIDEMIA 11/20/2008   on meds   Hypertension    on meds   OBESITY 01/24/2009   PERIPHERAL  VASCULAR DISEASE 01/24/2009   PREDIABETES 06/12/2010   Prostate CA ([place]) 2021   dx 05/13/2020   RHINITIS 04/03/2009   SHOULDER PAIN 11/26/2009    Past Surgical History:  Procedure Laterality Date   BACK SURGERY  02-29-12   lumbar fusion per Dr. Rennis Harding in [place] Right    SHOULDER SURGERY Left    WISDOM TOOTH EXTRACTION      Social History   Socioeconomic History   Marital status: Married    Spouse name: Not on file   Number of children: Not on file   Years of education: Not on file   Highest education level: Not on file  Occupational History   Not on file  Tobacco Use   Smoking status: Never   Smokeless tobacco: Never  Vaping Use   Vaping Use: Never used  Substance and Sexual Activity   Alcohol use: Not Currently    Alcohol/week: 1.0 standard drink    Types: 1 Glasses of wine per week   Drug use: No   Sexual activity: Yes  Other Topics Concern   Not on file  Social History Narrative   Not on file   Social Determinants of Health   Financial Resource Strain: Not on file  Food  Insecurity: Not on file  Transportation Needs: Not on file  Physical Activity: Not on file  Stress: Not on file  Social Connections: Not on file  Intimate Partner Violence: Not on file    Family History  Problem Relation Age of Onset   Hypertension Mother    Hyperlipidemia Mother    Hypertension Father    Heart disease Maternal Uncle    Cancer Paternal Grandfather        prostate cancer   Colon polyps Neg Hx    Colon cancer Neg Hx    Esophageal cancer Neg Hx    Stomach cancer Neg Hx    Rectal cancer Neg Hx     Current Outpatient Medications  Medication Sig Dispense Refill   aspirin 81 MG tablet Take 81 mg by mouth daily.       diclofenac sodium (VOLTAREN) 1 % GEL APPLY 4 GRAMS BY TOPICAL ROUTE 4 TIMES A DAY TO THE AFFECTED AREA(S)     esomeprazole (NEXIUM) 20 MG capsule Take 20 mg by mouth daily at 12 noon.     fexofenadine (ALLEGRA) 180 MG tablet Take 180 mg  by mouth daily.       fluticasone (FLONASE) 50 MCG/ACT nasal spray USE 2 SPRAYS INTO EACH NOSTRIL DAILY 16 g 3   gabapentin (NEURONTIN) 100 MG capsule TAKE 1 CAPSULE BY MOUTH TWICE A DAY 60 capsule 5   hydrochlorothiazide (HYDRODIURIL) 12.5 MG tablet TAKE 1 TABLET BY MOUTH EVERY DAY 30 tablet 5   losartan (COZAAR) 50 MG tablet TAKE 2 TABLETS BY MOUTH EVERY DAY 180 tablet 2   pyridOXINE (VITAMIN B-6) 100 MG tablet Take 100 mg by mouth daily.     rosuvastatin (CRESTOR) 10 MG tablet TAKE 1/2 TABLET BY MOUTH EVERY DAY 15 tablet 5   sildenafil (REVATIO) 20 MG tablet SMARTSIG:2-5 Tablet(s) By Mouth PRN     vitamin B-12 (CYANOCOBALAMIN) 1000 MCG tablet Take 1,000 mcg by mouth daily.     No current facility-administered medications for this visit.    Allergies  Allergen Reactions   Amoxicillin-Pot Clavulanate Diarrhea   Atorvastatin     REACTION: myalgia   Cephalexin Nausea Only and Nausea And Vomiting   Grass Pollen(K-O-R-T-Swt Vern)    Oxycodone Hcl Nausea Only     REVIEW OF SYSTEMS:  '[X]'$  denotes positive finding, '[ ]'$  denotes negative finding Cardiac  Comments:  Chest pain or chest pressure:    Shortness of breath upon exertion:    Short of breath when lying flat:    Irregular heart rhythm:        Vascular    Pain in calf, thigh, or hip brought on by ambulation:    Pain in feet at night that wakes you up from your sleep:     Blood clot in your veins:    Leg swelling:         Pulmonary    Oxygen at home:    Productive cough:     Wheezing:         Neurologic    Sudden weakness in arms or legs:     Sudden numbness in arms or legs:     Sudden onset of difficulty speaking or slurred speech:    Temporary loss of vision in one eye:     Problems with dizziness:         Gastrointestinal    Blood in stool:     Vomited blood:         Genitourinary  Burning when urinating:     Blood in urine:        Psychiatric    Major depression:         Hematologic    Bleeding  problems:    Problems with blood clotting too easily:        Skin    Rashes or ulcers:        Constitutional    Fever or chills:      PHYSICAL EXAMINATION:  There were no vitals filed for this visit.  General:  WDWN in NAD; vital signs documented above Gait: Normal HENT: WNL, normocephalic Pulmonary: normal non-labored breathing , without wheezing Cardiac: regular HR, without  Murmurs without carotid bruit Abdomen: soft, NT, no masses Vascular Exam/Pulses:  Right Left  Radial 2+ (normal) 2+ (normal)  Femoral 2+ (normal) 2+ (normal)  Popliteal 2+ (normal) 2+ (normal)  DP 2+ (normal) 2+ (normal)  PT 2+ (normal) 1+ (weak)   Extremities: without ischemic changes, without Gangrene , without cellulitis; without open wounds;  Musculoskeletal: no muscle wasting or atrophy  Neurologic: A&O X 3;  No focal weakness or paresthesias are detected Psychiatric:  The pt has Normal affect.   Non-Invasive Vascular Imaging:    +-------+-----------+-----------+------------+------------+  ABI/TBIToday's ABIToday's TBIPrevious ABIPrevious TBI  +-------+-----------+-----------+------------+------------+  Right  1.27       0.99       1.23        0.84          +-------+-----------+-----------+------------+------------+  Left   0.85       0.59       0.86        0.62          +-------+-----------+-----------+------------+------------+   ASSESSMENT/PLAN:: 60 y.o. male here for follow up for peripheral artery disease. He has been doing great. Resolution of claudication symptoms. No claudication, rest pain or tissue loss. Encourage him to continue his walking regimen. - ABI's today are essentially unchanged from prior study. - he will continue his Aspirin and statin - He will follow up sooner should he have any new or concerning symptoms - He will return in 2 years with ABIs   Karoline Caldwell, PA-C [facility] [phone]  Clinic MD:   [place]

## 2021-06-08 ENCOUNTER — Other Ambulatory Visit: Payer: Self-pay

## 2021-06-08 ENCOUNTER — Encounter: Payer: Self-pay | Admitting: Family Medicine

## 2021-06-08 ENCOUNTER — Ambulatory Visit (INDEPENDENT_AMBULATORY_CARE_PROVIDER_SITE_OTHER): Payer: 59 | Admitting: Family Medicine

## 2021-06-08 ENCOUNTER — Other Ambulatory Visit: Payer: Self-pay | Admitting: Family Medicine

## 2021-06-08 VITALS — BP 140/88 | HR 74 | Temp 98.5°F | Ht 72.0 in | Wt 276.0 lb

## 2021-06-08 DIAGNOSIS — Z0000 Encounter for general adult medical examination without abnormal findings: Secondary | ICD-10-CM

## 2021-06-08 LAB — CBC WITH DIFFERENTIAL/PLATELET
Basophils Absolute: 0 10*3/uL (ref 0.0–0.1)
Basophils Relative: 0.6 % (ref 0.0–3.0)
Eosinophils Absolute: 0.3 10*3/uL (ref 0.0–0.7)
Eosinophils Relative: 5.7 % — ABNORMAL HIGH (ref 0.0–5.0)
HCT: 47 % (ref 39.0–52.0)
Hemoglobin: 15.5 g/dL (ref 13.0–17.0)
Lymphocytes Relative: 33.7 % (ref 12.0–46.0)
Lymphs Abs: 1.9 10*3/uL (ref 0.7–4.0)
MCHC: 33 g/dL (ref 30.0–36.0)
MCV: 84.3 fl (ref 78.0–100.0)
Monocytes Absolute: 0.5 10*3/uL (ref 0.1–1.0)
Monocytes Relative: 9.5 % (ref 3.0–12.0)
Neutro Abs: 2.8 10*3/uL (ref 1.4–7.7)
Neutrophils Relative %: 50.5 % (ref 43.0–77.0)
Platelets: 161 10*3/uL (ref 150.0–400.0)
RBC: 5.57 Mil/uL (ref 4.22–5.81)
RDW: 14.6 % (ref 11.5–15.5)
WBC: 5.5 10*3/uL (ref 4.0–10.5)

## 2021-06-08 LAB — BASIC METABOLIC PANEL
BUN: 16 mg/dL (ref 6–23)
CO2: 30 mEq/L (ref 19–32)
Calcium: 9.1 mg/dL (ref 8.4–10.5)
Chloride: 104 mEq/L (ref 96–112)
Creatinine, Ser: 0.99 mg/dL (ref 0.40–1.50)
GFR: 82.36 mL/min (ref >60.00)
Glucose, Bld: 104 mg/dL — ABNORMAL HIGH (ref 70–99)
Potassium: 4.2 mEq/L (ref 3.5–5.1)
Sodium: 140 mEq/L (ref 135–145)

## 2021-06-08 LAB — HEPATIC FUNCTION PANEL
ALT: 19 U/L (ref 0–53)
AST: 16 U/L (ref 0–37)
Albumin: 4.3 g/dL (ref 3.5–5.2)
Alkaline Phosphatase: 75 U/L (ref 39–117)
Bilirubin, Direct: 0.1 mg/dL (ref 0.0–0.3)
Total Bilirubin: 0.6 mg/dL (ref 0.2–1.2)
Total Protein: 6.7 g/dL (ref 6.0–8.3)

## 2021-06-08 LAB — TSH: TSH: 0.92 u[IU]/mL (ref 0.35–5.50)

## 2021-06-08 LAB — LIPID PANEL
Cholesterol: 113 mg/dL (ref 0–200)
HDL: 38.7 mg/dL — ABNORMAL LOW (ref >39.00)
LDL Cholesterol: 46 mg/dL (ref 0–99)
NonHDL: 74.63
Total CHOL/HDL Ratio: 3
Triglycerides: 143 mg/dL (ref 0.0–149.0)
VLDL: 28.6 mg/dL (ref 0.0–40.0)

## 2021-06-08 MED ORDER — SILDENAFIL CITRATE 20 MG PO TABS: ORAL_TABLET | ORAL | 5 refills | Status: DC

## 2021-06-08 NOTE — Patient Instructions (Signed)
Consider Shingrix vaccine at some point in this next year.

## 2021-06-08 NOTE — Progress Notes (Signed)
Established Patient Office Visit  Subjective:  Patient ID: Paul [name], male    DOB: 1961/02/10  Age: 60 y.o. MRN: [id number]  CC:  Chief Complaint  Patient presents with   Annual Exam    HPI Paul [name] presents for physical exam.  He states his 50 year old mother died 2023-04-22.  They think this may have been heart related.  He has been very busy taking care of his 81 year old father.  His wife also has COPD oxygen dependent and he cares for her a lot.  They have been married 34 years. He has lost some weight this year due to his efforts.  He has history of prostate cancer and is seen regularly by urologist.  Has had recent PSA done through them.  Health maintenance reviewed  -Previous hepatitis C screen negative -Flu vaccine already given -Tetanus due 2029 -No history of Shingrix vaccine but has had shingles -COVID vaccines up-to-date  Family history-mother died recently age 77.  She was generally fairly healthy.  They are not sure but thinks she may have had some heart issues.  Half brother with Huntington's disease but this was not through the side of his family related to him biologically.  He has a sister who is alive and well.   Past Medical History:  Diagnosis Date   Allergy    seasonal allergies   Arthritis    on meds   Cancer ([place])    skin   Cervicalgia 12/17/2009   GERD 11/20/2008   on meds   HYPERLIPIDEMIA 11/20/2008   on meds   Hypertension    on meds   OBESITY 01/24/2009   PERIPHERAL VASCULAR DISEASE 01/24/2009   PREDIABETES 06/12/2010   Prostate CA ([place]) 2021   dx 05/13/2020   RHINITIS 04/03/2009   SHOULDER PAIN 11/26/2009    Past Surgical History:  Procedure Laterality Date   BACK SURGERY  02-29-12   lumbar fusion per Dr. Rennis Harding in [place] Right    SHOULDER SURGERY Left    WISDOM TOOTH EXTRACTION      Family History  Problem Relation Age of Onset   Hypertension Mother    Hyperlipidemia Mother    Hypertension Father     Heart disease Maternal Uncle    Cancer Paternal Grandfather        prostate cancer   Colon polyps Neg Hx    Colon cancer Neg Hx    Esophageal cancer Neg Hx    Stomach cancer Neg Hx    Rectal cancer Neg Hx     Social History   Socioeconomic History   Marital status: Married    Spouse name: Not on file   Number of children: Not on file   Years of education: Not on file   Highest education level: Not on file  Occupational History   Not on file  Tobacco Use   Smoking status: Never   Smokeless tobacco: Never  Vaping Use   Vaping Use: Never used  Substance and Sexual Activity   Alcohol use: Not Currently    Alcohol/week: 1.0 standard drink    Types: 1 Glasses of wine per week   Drug use: No   Sexual activity: Yes  Other Topics Concern   Not on file  Social History Narrative   Not on file   Social Determinants of Health   Financial Resource Strain: Not on file  Food Insecurity: Not on file  Transportation Needs: Not on file  Physical Activity: Not on file  Stress: Not on file  Social Connections: Not on file  Intimate Partner Violence: Not on file    Outpatient Medications Prior to Visit  Medication Sig Dispense Refill   aspirin 81 MG tablet Take 81 mg by mouth daily.       Cholecalciferol (VITAMIN D3) 50 MCG (2000 UT) TABS Take by mouth.     diclofenac sodium (VOLTAREN) 1 % GEL APPLY 4 GRAMS BY TOPICAL ROUTE 4 TIMES A DAY TO THE AFFECTED AREA(S)     esomeprazole (NEXIUM) 20 MG capsule Take 20 mg by mouth daily at 12 noon.     fexofenadine (ALLEGRA) 180 MG tablet Take 180 mg by mouth daily.       fluticasone (FLONASE) 50 MCG/ACT nasal spray USE 2 SPRAYS INTO EACH NOSTRIL DAILY 16 g 3   gabapentin (NEURONTIN) 100 MG capsule TAKE 1 CAPSULE BY MOUTH TWICE A DAY 60 capsule 5   hydrochlorothiazide (HYDRODIURIL) 12.5 MG tablet TAKE 1 TABLET BY MOUTH EVERY DAY 30 tablet 5   losartan (COZAAR) 50 MG tablet TAKE 2 TABLETS BY MOUTH EVERY DAY 180 tablet 2   pyridOXINE (VITAMIN  B-6) 100 MG tablet Take 100 mg by mouth daily.     rosuvastatin (CRESTOR) 10 MG tablet TAKE 1/2 TABLET BY MOUTH EVERY DAY 15 tablet 5   vitamin B-12 (CYANOCOBALAMIN) 1000 MCG tablet Take 1,000 mcg by mouth daily.     sildenafil (REVATIO) 20 MG tablet SMARTSIG:2-5 Tablet(s) By Mouth PRN     triamcinolone cream (KENALOG) 0.1 % Apply topically 2 (two) times daily as needed.     No facility-administered medications prior to visit.    Allergies  Allergen Reactions   Amoxicillin-Pot Clavulanate Diarrhea   Atorvastatin     REACTION: myalgia   Cephalexin Nausea Only and Nausea And Vomiting   Grass Pollen(K-O-R-T-Swt Vern)    Oxycodone Hcl Nausea Only    ROS Review of Systems  Constitutional:  Negative for activity change, appetite change, fatigue, fever and unexpected weight change.  HENT:  Negative for congestion, ear pain and trouble swallowing.   Eyes:  Negative for pain and visual disturbance.  Respiratory:  Negative for cough, shortness of breath and wheezing.   Cardiovascular:  Negative for chest pain and palpitations.  Gastrointestinal:  Negative for abdominal distention, abdominal pain, blood in stool, constipation, diarrhea, nausea, rectal pain and vomiting.  Endocrine: Negative for polydipsia and polyuria.  Genitourinary:  Negative for dysuria, hematuria and testicular pain.  Musculoskeletal:  Positive for back pain. Negative for arthralgias and joint swelling.  Skin:  Negative for rash.  Neurological:  Negative for dizziness, syncope and headaches.  Hematological:  Negative for adenopathy.  Psychiatric/Behavioral:  Negative for confusion and dysphoric mood.      Objective:    Physical Exam Constitutional:      General: He is not in acute distress.    Appearance: He is well-developed.  HENT:     Head: Normocephalic and atraumatic.     Right Ear: External ear normal.     Left Ear: External ear normal.  Eyes:     Conjunctiva/sclera: Conjunctivae normal.     Pupils:  Pupils are equal, round, and reactive to light.  Neck:     Thyroid: No thyromegaly.  Cardiovascular:     Rate and Rhythm: Normal rate and regular rhythm.     Heart sounds: Normal heart sounds. No murmur heard. Pulmonary:     Effort: No respiratory distress.  Breath sounds: No wheezing or rales.  Abdominal:     General: Bowel sounds are normal. There is no distension.     Palpations: Abdomen is soft. There is no mass.     Tenderness: There is no abdominal tenderness. There is no guarding or rebound.  Musculoskeletal:     Cervical back: Normal range of motion and neck supple.     Right lower leg: No edema.     Left lower leg: No edema.  Lymphadenopathy:     Cervical: No cervical adenopathy.  Skin:    Findings: No rash.  Neurological:     Mental Status: He is alert and oriented to person, place, and time.     Cranial Nerves: No cranial nerve deficit.  Psychiatric:        Mood and Affect: Mood normal.        Thought Content: Thought content normal.    BP 140/88 (BP Location: Left Arm, Patient Position: Sitting, Cuff Size: Large)   Pulse 74   Temp 98.5 F (36.9 C) (Oral)   Ht 6' (1.829 m)   Wt 276 lb (125.2 kg)   SpO2 97%   BMI 37.43 kg/m  Wt Readings from Last 3 Encounters:  06/08/21 276 lb (125.2 kg)  01/07/21 295 lb 6.4 oz (134 kg)  06/04/20 296 lb 11.2 oz (134.6 kg)     Health Maintenance Due  Topic Date Due   HIV Screening  Never done   Zoster Vaccines- Shingrix (1 of 2) Never done   COVID-19 Vaccine (4 - Booster for Pfizer series) 09/01/2020    There are no preventive care reminders to display for this patient.  Lab Results  Component Value Date   TSH 1.00 06/04/2019   Lab Results  Component Value Date   WBC 5.6 06/04/2020   HGB 15.5 06/04/2020   HCT 47.4 06/04/2020   MCV 86.5 06/04/2020   PLT 157 06/04/2020   Lab Results  Component Value Date   NA 143 06/04/2020   K 4.6 06/04/2020   CO2 31 06/04/2020   GLUCOSE 108 (H) 06/04/2020   BUN 19  06/04/2020   CREATININE 0.89 06/04/2020   BILITOT 0.5 06/04/2020   ALKPHOS 78 06/04/2019   AST 15 06/04/2020   ALT 22 06/04/2020   PROT 6.4 06/04/2020   ALBUMIN 4.3 06/04/2019   CALCIUM 9.2 06/04/2020   GFR 79.15 06/04/2019   Lab Results  Component Value Date   CHOL 111 06/04/2020   Lab Results  Component Value Date   HDL 39 (L) 06/04/2020   Lab Results  Component Value Date   LDLCALC 56 06/04/2020   Lab Results  Component Value Date   TRIG 82 06/04/2020   Lab Results  Component Value Date   CHOLHDL 2.8 06/04/2020   Lab Results  Component Value Date   HGBA1C 6.0 (H) 06/04/2020      Assessment & Plan:   Problem List Items Addressed This Visit   None Visit Diagnoses     Physical exam    -  Primary   Relevant Orders   Basic metabolic panel   Lipid panel   CBC with Differential/Platelet   TSH   Hepatic function panel     Patient has history of prostate cancer followed by urology.  We discussed the following health maintenance issues  -Vaccines up-to-date with exception of Shingrix.  He will consider after checking on insurance coverage. -Obtain screening labs as above -Refill sildenafil for as needed use -Colonoscopy up-to-date  Meds ordered this encounter  Medications   sildenafil (REVATIO) 20 MG tablet    Sig: SMARTSIG:2-5 Tablet(s) By Mouth PRN    Dispense:  15 tablet    Refill:  5    Follow-up: No follow-ups on file.    Carolann Littler, MD

## 2021-06-26 ENCOUNTER — Other Ambulatory Visit: Payer: Self-pay | Admitting: Family Medicine

## 2021-07-05 ENCOUNTER — Other Ambulatory Visit: Payer: Self-pay | Admitting: Family Medicine

## 2021-07-15 ENCOUNTER — Other Ambulatory Visit: Payer: Self-pay | Admitting: Family Medicine

## 2021-11-25 ENCOUNTER — Other Ambulatory Visit: Payer: Self-pay | Admitting: Family Medicine

## 2021-11-30 ENCOUNTER — Telehealth: Payer: Self-pay | Admitting: Family Medicine

## 2021-11-30 ENCOUNTER — Telehealth (INDEPENDENT_AMBULATORY_CARE_PROVIDER_SITE_OTHER): Payer: 59 | Admitting: Family Medicine

## 2021-11-30 DIAGNOSIS — R0981 Nasal congestion: Secondary | ICD-10-CM | POA: Diagnosis not present

## 2021-11-30 DIAGNOSIS — R059 Cough, unspecified: Secondary | ICD-10-CM

## 2021-11-30 MED ORDER — BENZONATATE 100 MG PO CAPS: ORAL_CAPSULE | ORAL | 0 refills | Status: DC

## 2021-11-30 MED ORDER — DOXYCYCLINE HYCLATE 100 MG PO TABS: 100.0000 mg | ORAL_TABLET | Freq: Two times a day (BID) | ORAL | 0 refills | Status: DC

## 2021-11-30 NOTE — Telephone Encounter (Signed)
Patient calling in with respiratory symptoms: ?Shortness of breath, chest pain, palpitations or other red words send to Triage ? ?Does the patient have a fever over 100, cough, congestion, sore throat, runny nose, lost of taste/smell (please list symptoms that patient has)? Green congestion in head, throat and chest ? ?What date did symptoms start? 11/30/21 ?(If over 5 days ago, pt may be scheduled for in person visit) ? ?Have you tested for Covid in the last 5 days? No  ? ?If yes, was it positive '[]'$  OR negative '[]'$ ? If positive in the last 5 days, please schedule virtual visit now. If negative, schedule for an in person OV with the next available provider if PCP has no openings. Please also let patient know they will be tested again (follow the script below) ? ?"you will have to arrive 61mns prior to your appt time to be Covid tested. Please park in back of office at the [facility] & call [phone] let the staff know you have arrived. A staff member will meet you at your car to do a rapid covid test. Once the test has resulted you will be notified by phone of your results to determine if appt will remain an in person visit or be converted to a virtual/phone visit. If you arrive less than 356ms before your appt time, your visit will be automatically converted to virtual & any recommended testing will happen AFTER the visit." ? ? ?THINGS TO REMEMBER ? ?If no availability for virtual visit in office,  please schedule another [facility] office ? ?If no availability at another [facility], please instruct patient that they can schedule an evisit or virtual visit through their mychart account. Visits up to 8pm ? ?patients can be seen in office 5 days after positive COVID test ? ? ? ?

## 2021-11-30 NOTE — Patient Instructions (Signed)
-  I sent the medication(s) we discussed to your pharmacy: ?Meds ordered this encounter  ?Medications  ? doxycycline (VIBRA-TABS) 100 MG tablet  ?  Sig: Take 1 tablet (100 mg total) by mouth 2 (two) times daily.  ?  Dispense:  14 tablet  ?  Refill:  0  ? benzonatate (TESSALON PERLES) 100 MG capsule  ?  Sig: 1-2 capsules up to twice daily as needed for cough  ?  Dispense:  30 capsule  ?  Refill:  0  ? ? ? ?I hope you are feeling better soon! ? ?Seek in person care promptly if your symptoms worsen, new concerns arise or you are not improving with treatment. ? ?It was nice to meet you today. I help [facility] out with telemedicine visits on Tuesdays and Thursdays and am happy to help if you need a virtual follow up visit on those days. Otherwise, if you have any concerns or questions following this visit please schedule a follow up visit with your Primary Care office or seek care at a local urgent care clinic to avoid delays in care ? ?

## 2021-11-30 NOTE — Telephone Encounter (Signed)
Pt has OV scheduled with Dr. Maudie Mercury today message has been routed to her for [facility]. ?

## 2021-11-30 NOTE — Progress Notes (Signed)
Virtual Visit via Telephone Note ? ?I connected with Paul [name] on 11/30/21 at 10:20 AM EDT by telephone and verified that I am speaking with the correct person using two identifiers. ?  ?I discussed the limitations of performing an evaluation and management service by telephone and requested permission for a phone visit. The patient expressed understanding and agreed to proceed. ? ?Location patient:  House ?Location provider: work or home office ?Participants present for the call: patient, provider ?Patient did not have a visit with me in the prior 7 days to address this/these issue(s). ? ? ?History of Present Illness: ? ?Acute telemedicine visit for sinus issues: ?-reports hx of sinus infection and feels that is what is going on ?-Onset: over 1 week, but worse the last 3 days with green mucus ?-has done two covid tests which have been negative ?-Symptoms include: nasal congestion, sore throat, cough, now green and thicker mucus, ear ache - now resolved, sneezing a little ?-Denies:fever, malaise, CP, SOB ?-Has tried: musinex ?-Pertinent past medical history: see below, has seasonal allergies, allegra, flonase ?-Pertinent medication allergies:  ?Allergies  ?Allergen Reactions  ? Amoxicillin-Pot Clavulanate Diarrhea  ? Atorvastatin   ?  REACTION: myalgia  ? Cephalexin Nausea Only and Nausea And Vomiting  ? Grass Pollen(K-O-R-T-Swt Vern)   ? Oxycodone Hcl Nausea Only  ?-COVID-19 vaccine status: ?Immunization History  ?Administered Date(s) Administered  ? Influenza Split 06/02/2012  ? Influenza, Seasonal, Injecte, Preservative Fre 05/27/2014  ? Influenza,inj,Quad PF,6+ Mos 05/02/2013, 05/31/2017, 04/14/2018, 04/15/2019, 04/02/2020, 04/29/2021  ? Influenza-Unspecified 05/24/2015, 05/04/2016, 04/14/2018  ? PFIZER(Purple Top)SARS-COV-2 Vaccination 11/16/2019, 12/10/2019, 06/09/2020  ? Pension scheme manager 61yr & up 05/07/2021  ? Td 08/23/2000, 12/20/2007  ? Tdap 06/02/2018  ? ? ?  ?Past Medical  History:  ?Diagnosis Date  ? Allergy   ? seasonal allergies  ? Arthritis   ? on meds  ? Cancer ([facility]   ? skin  ? Cervicalgia 12/17/2009  ? GERD 11/20/2008  ? on meds  ? HYPERLIPIDEMIA 11/20/2008  ? on meds  ? Hypertension   ? on meds  ? OBESITY 01/24/2009  ? PERIPHERAL VASCULAR DISEASE 01/24/2009  ? PREDIABETES 06/12/2010  ? Prostate CA ([place] 2021  ? dx 05/13/2020  ? RHINITIS 04/03/2009  ? SHOULDER PAIN 11/26/2009  ? ? ?Current Outpatient Medications on File Prior to Visit  ?Medication Sig Dispense Refill  ? aspirin 81 MG tablet Take 81 mg by mouth daily.      ? Cholecalciferol (VITAMIN D3) 50 MCG (2000 UT) TABS Take by mouth.    ? diclofenac sodium (VOLTAREN) 1 % GEL APPLY 4 GRAMS BY TOPICAL ROUTE 4 TIMES A DAY TO THE AFFECTED AREA(S)    ? esomeprazole (NEXIUM) 20 MG capsule Take 20 mg by mouth daily at 12 noon.    ? fexofenadine (ALLEGRA) 180 MG tablet Take 180 mg by mouth daily.      ? fluticasone (FLONASE) 50 MCG/ACT nasal spray USE 2 SPRAYS INTO EACH NOSTRIL DAILY 16 g 3  ? gabapentin (NEURONTIN) 100 MG capsule TAKE 1 CAPSULE BY MOUTH TWICE A DAY 60 capsule 5  ? hydrochlorothiazide (HYDRODIURIL) 12.5 MG tablet TAKE 1 TABLET BY MOUTH EVERY DAY 30 tablet 5  ? losartan (COZAAR) 50 MG tablet TAKE 2 TABLETS BY MOUTH EVERY DAY 60 tablet 8  ? pyridOXINE (VITAMIN B-6) 100 MG tablet Take 100 mg by mouth daily.    ? rosuvastatin (CRESTOR) 10 MG tablet TAKE 1/2 TABLET BY MOUTH EVERY DAY 15 tablet  5  ? sildenafil (REVATIO) 20 MG tablet SMARTSIG:2-5 TABLET(S) BY MOUTH PRN 15 tablet 5  ? triamcinolone cream (KENALOG) 0.1 % Apply topically 2 (two) times daily as needed.    ? vitamin B-12 (CYANOCOBALAMIN) 1000 MCG tablet Take 1,000 mcg by mouth daily.    ? ?No current facility-administered medications on file prior to visit.  ? ? ?Observations/Objective: ?Patient sounds cheerful and well on the phone. ?I do not appreciate any SOB. ?Speech and thought processing are grossly intact. ?Patient reported vitals: denies fever ? ?Assessment and  Plan: ? ?Nasal sinus congestion ? ?Cough, unspecified type ? ?-we discussed possible serious and likely etiologies, options for evaluation and workup, limitations of telemedicine visit vs in person visit, treatment, treatment risks and precautions. Pt prefers to treat via telemedicine empirically rather than in person at this moment. He is on a decent allergy regimen. He feels this may be a bacterial sinusitis - query this, allergies or VURI. He has decided to try continuation of allergy regimen, nasal saline rinse, tessalon for cough with initiation of doxy if worsening or not improving over the next several days. Discussed signs symptoms of bacterial illness, appropriate abx use, etc.  ?ist and advised patient to contact PCP office to schedule if does not receive call back in next 24 hours. ?Advised to seek prompt virtual visit or in person care if worsening, new symptoms arise, or if is not improving with treatment as expected per our conversation of expected course. Discussed options for follow up care. Did let this patient know that I do telemedicine on Tuesdays and Thursdays for [facility] and those are the days I am logged into the system - however let him know I work Monday, thurs and Friday this week. Advised to schedule follow up visit with PCP, [place] virtual visits or [facility] if any further questions or concerns to avoid delays in care. ?  ?I discussed the assessment and treatment plan with the patient. The patient was provided an opportunity to ask questions and all were answered. The patient agreed with the plan and demonstrated an understanding of the instructions. ?  ? ?Follow Up Instructions: ? ?I did not refer this patient for an OV with me in the next 24 hours for this/these issue(s). ? ?I discussed the assessment and treatment plan with the patient. The patient was provided an opportunity to ask questions and all were answered. The patient agreed with the plan and demonstrated an understanding of  the instructions. ? ? ?I spent 15 minutes on the date of this visit in the care of this patient. See summary of tasks completed to properly care for this patient in the detailed notes above which also included counseling of above, review of PMH, medications, allergies, evaluation of the patient and ordering and/or  instructing patient on testing and care options.   ? ? ?Lucretia Kern, DO  ? ?

## 2021-12-20 ENCOUNTER — Other Ambulatory Visit: Payer: Self-pay | Admitting: Family Medicine

## 2021-12-20 DIAGNOSIS — I10 Essential (primary) hypertension: Secondary | ICD-10-CM

## 2022-01-05 ENCOUNTER — Other Ambulatory Visit: Payer: Self-pay | Admitting: Family Medicine

## 2022-01-27 ENCOUNTER — Other Ambulatory Visit: Payer: Self-pay | Admitting: Family Medicine

## 2022-03-15 ENCOUNTER — Other Ambulatory Visit: Payer: Self-pay | Admitting: Family Medicine

## 2022-03-15 ENCOUNTER — Ambulatory Visit: Payer: 59 | Admitting: Family Medicine

## 2022-03-15 ENCOUNTER — Encounter: Payer: Self-pay | Admitting: Family Medicine

## 2022-03-15 VITALS — BP 174/88 | HR 87 | Temp 97.4°F | Ht 72.0 in | Wt 295.1 lb

## 2022-03-15 DIAGNOSIS — I10 Essential (primary) hypertension: Secondary | ICD-10-CM

## 2022-03-15 DIAGNOSIS — L0292 Furuncle, unspecified: Secondary | ICD-10-CM

## 2022-03-15 MED ORDER — DOXYCYCLINE HYCLATE 100 MG PO CAPS: 100.0000 mg | ORAL_CAPSULE | Freq: Two times a day (BID) | ORAL | 0 refills | Status: DC

## 2022-03-15 MED ORDER — AMLODIPINE BESYLATE 5 MG PO TABS: 5.0000 mg | ORAL_TABLET | Freq: Every day | ORAL | 5 refills | Status: DC

## 2022-03-15 NOTE — Patient Instructions (Signed)
Try some warm baths to soak the area  Start the antibiotic- and take twice daily  Watch for any progressive redness, swelling, or fluctuance.

## 2022-03-15 NOTE — Progress Notes (Signed)
Established Patient Office Visit  Subjective   Patient ID: Paul [name], male    DOB: [birth year]  Age: 61 y.o. MRN: [id number]  Chief Complaint  Patient presents with   Testicular Mass    Patient complains of testicular mass, x1 day    HPI   Benny seen for the following issues  He has swollen slightly tender area just inferior and posterior to the scrotal region.  He just noticed this Sunday morning.  Minimally painful.  No fevers or chills.  No drainage.  He particularly was concerned because he had a cousin that had what sounds like possible Fournier's gangrene in this area last year.  Jaecion has no history of diabetes.  He does have hypertension currently treated with HCTZ 12.5 mg daily and losartan 100 mg daily.  No recent headaches.  No dizziness.  No recent dietary changes.  Non-smoker.  Tries to watch his sodium intake.  Weight stable.  No alcohol.  Past Medical History:  Diagnosis Date   Allergy    seasonal allergies   Arthritis    on meds   Cancer ([place])    skin   Cervicalgia 12/17/2009   GERD 11/20/2008   on meds   HYPERLIPIDEMIA 11/20/2008   on meds   Hypertension    on meds   OBESITY 01/24/2009   PERIPHERAL VASCULAR DISEASE 01/24/2009   PREDIABETES 06/12/2010   Prostate CA ([place]) 2021   dx 05/13/2020   RHINITIS 04/03/2009   SHOULDER PAIN 11/26/2009   Past Surgical History:  Procedure Laterality Date   BACK SURGERY  02-29-12   lumbar fusion per Dr. Rennis Harding in [place] Right    SHOULDER SURGERY Left    WISDOM TOOTH EXTRACTION      reports that he has never smoked. He has never used smokeless tobacco. He reports that he does not currently use alcohol after a past usage of about 1.0 standard drink of alcohol per week. He reports that he does not use drugs. family history includes Cancer in his paternal grandfather; Heart disease in his maternal uncle; Hyperlipidemia in his mother; Hypertension in his father and mother. Allergies  Allergen  Reactions   Amoxicillin-Pot Clavulanate Diarrhea   Atorvastatin     REACTION: myalgia   Cephalexin Nausea Only and Nausea And Vomiting   Grass Pollen(K-O-R-T-Swt Vern)    Oxycodone Hcl Nausea Only    Review of Systems  Constitutional:  Negative for chills, fever and malaise/fatigue.  Eyes:  Negative for blurred vision.  Respiratory:  Negative for shortness of breath.   Cardiovascular:  Negative for chest pain.  Neurological:  Negative for dizziness, weakness and headaches.      Objective:     BP (!) 174/88 (BP Location: Left Arm, Cuff Size: Normal)   Pulse 87   Temp (!) 97.4 F (36.3 C) (Oral)   Ht 6' (1.829 m)   Wt 295 lb 1.6 oz (133.9 kg)   SpO2 98%   BMI 40.02 kg/m  BP Readings from Last 3 Encounters:  03/15/22 (!) 174/88  06/08/21 140/88  01/07/21 (!) 146/94   Wt Readings from Last 3 Encounters:  03/15/22 295 lb 1.6 oz (133.9 kg)  06/08/21 276 lb (125.2 kg)  01/07/21 295 lb 6.4 oz (134 kg)      Physical Exam Constitutional:      Appearance: He is well-developed.  HENT:     Right Ear: External ear normal.     Left Ear: External  ear normal.  Eyes:     Pupils: Pupils are equal, round, and reactive to light.  Neck:     Thyroid: No thyromegaly.  Cardiovascular:     Rate and Rhythm: Normal rate and regular rhythm.  Pulmonary:     Effort: Pulmonary effort is normal. No respiratory distress.     Breath sounds: Normal breath sounds. No wheezing or rales.  Musculoskeletal:     Cervical back: Neck supple.  Skin:    Comments: Perineum region is examined.  Just posterior to the scrotal region he has small furuncle just couple millimeters in diameter.  Indurated and nonfluctuant.  No pustular center.  Minimally tender.  Neurological:     Mental Status: He is alert and oriented to person, place, and time.      No results found for any visits on 03/15/22.    The ASCVD Risk score (Arnett DK, et al., 2019) failed to calculate for the following reasons:   The  valid total cholesterol range is 130 to 320 mg/dL    Assessment & Plan:   #1 small furuncle just posterior to the scrotal region.  No fluctuance.  No cellulitis changes.  Question related to ingrown hair. -Recommend sitz baths daily -Start doxycycline 100 mg twice daily for 7 days -Follow-up promptly for increased swelling, redness, or pain  #2 hypertension poorly controlled with exacerbation.  He had a couple of other readings up above 631 systolic during the past year and a half.  Repeat today left arm seated after rest 174/88 -Stressed importance of low-sodium diet -Continue losartan 100 mg daily and HCTZ 12.5 mg daily and add amlodipine 5 mg daily -Set up 2-week follow-up to reassess  Return in about 2 weeks (around 03/29/2022).    Carolann Littler, MD

## 2022-03-29 ENCOUNTER — Ambulatory Visit: Payer: 59 | Admitting: Family Medicine

## 2022-03-29 ENCOUNTER — Encounter: Payer: Self-pay | Admitting: Family Medicine

## 2022-03-29 VITALS — BP 138/70 | HR 85 | Temp 98.3°F | Ht 72.0 in | Wt 290.9 lb

## 2022-03-29 DIAGNOSIS — I10 Essential (primary) hypertension: Secondary | ICD-10-CM

## 2022-03-29 MED ORDER — AMLODIPINE BESYLATE 5 MG PO TABS: 5.0000 mg | ORAL_TABLET | Freq: Every day | ORAL | 3 refills | Status: DC

## 2022-03-29 NOTE — Patient Instructions (Signed)
Try to keep daily sodium intake < 2,500 mg  Try to lose some weight  Let's monitor for now.    Be in touch if BP consistently > 150/90.

## 2022-03-29 NOTE — Progress Notes (Signed)
Established Patient Office Visit  Subjective   Patient ID: Paul [name], male    DOB: 1961/03/09  Age: 61 y.o. MRN: [id number]  Chief Complaint  Patient presents with   Follow-up    HPI   Recently seen with small furuncle around the scrotal area.  Was treated with doxycycline.  He feels this is fully resolved.  Here for follow-up hypertension.  He already takes losartan 100 mg daily and HCTZ.  We added amlodipine last visit.  Tolerating well with no side effects.  Has been very busy with helping care for his debilitated father and his wife also has healthcare needs.  Brings in log of home blood pressure readings which are fairly consistently around 081-448 systolic and 80 to low 18H diastolic.  He brings in his cuff today to compare with ours. He does not use any alcohol.  Tries to watch sodium intake and is using a salt substitute.  Past Medical History:  Diagnosis Date   Allergy    seasonal allergies   Arthritis    on meds   Cancer ([place])    skin   Cervicalgia 12/17/2009   GERD 11/20/2008   on meds   HYPERLIPIDEMIA 11/20/2008   on meds   Hypertension    on meds   OBESITY 01/24/2009   PERIPHERAL VASCULAR DISEASE 01/24/2009   PREDIABETES 06/12/2010   Prostate CA ([place]) 2021   dx 05/13/2020   RHINITIS 04/03/2009   SHOULDER PAIN 11/26/2009   Past Surgical History:  Procedure Laterality Date   BACK SURGERY  02-29-12   lumbar fusion per Dr. Rennis Harding in [place] Right    SHOULDER SURGERY Left    WISDOM TOOTH EXTRACTION      reports that he has never smoked. He has never used smokeless tobacco. He reports that he does not currently use alcohol after a past usage of about 1.0 standard drink of alcohol per week. He reports that he does not use drugs. family history includes Cancer in his paternal grandfather; Heart disease in his maternal uncle; Hyperlipidemia in his mother; Hypertension in his father and mother. Allergies  Allergen Reactions   Amoxicillin-Pot  Clavulanate Diarrhea   Atorvastatin     REACTION: myalgia   Cephalexin Nausea Only and Nausea And Vomiting   Grass Pollen(K-O-R-T-Swt Vern)    Oxycodone Hcl Nausea Only    Review of Systems  Constitutional:  Negative for malaise/fatigue.  Eyes:  Negative for blurred vision.  Respiratory:  Negative for shortness of breath.   Cardiovascular:  Negative for chest pain.  Neurological:  Negative for dizziness, weakness and headaches.      Objective:     BP 138/70 (BP Location: Left Arm, Cuff Size: Normal)   Pulse 85   Temp 98.3 F (36.8 C) (Oral)   Ht 6' (1.829 m)   Wt 290 lb 14.4 oz (132 kg)   SpO2 98%   BMI 39.45 kg/m    Physical Exam Constitutional:      Appearance: He is well-developed.  Eyes:     Pupils: Pupils are equal, round, and reactive to light.  Neck:     Thyroid: No thyromegaly.  Cardiovascular:     Rate and Rhythm: Normal rate and regular rhythm.  Pulmonary:     Effort: Pulmonary effort is normal. No respiratory distress.     Breath sounds: Normal breath sounds. No wheezing or rales.  Musculoskeletal:     Cervical back: Neck supple.  Right lower leg: No edema.     Left lower leg: No edema.  Neurological:     Mental Status: He is alert and oriented to person, place, and time.      No results found for any visits on 03/29/22.    The ASCVD Risk score (Arnett DK, et al., 2019) failed to calculate for the following reasons:   The valid total cholesterol range is 130 to 320 mg/dL    Assessment & Plan:   Problem List Items Addressed This Visit       Unprioritized   Hypertension - Primary   Relevant Medications   amLODipine (NORVASC) 5 MG tablet  Blood pressure improved from last visit.  We obtained reading with his machine which came back 170/80 but repeat not me same arm 138/70.    -Try to keep sodium intake less than 2500 mg -We challenged him to try to lose some weight which should help with even further blood pressure control -Continue  combination therapy with losartan, amlodipine, and HCTZ -Set up 65-monthfollow-up to reassess  Return in about 3 months (around 06/29/2022).    BCarolann Littler MD

## 2022-04-12 ENCOUNTER — Telehealth: Payer: Self-pay | Admitting: Family Medicine

## 2022-04-12 NOTE — Telephone Encounter (Signed)
Pt states amLODipine (NORVASC) 5 MG tablet is making him dizzy and lightheaded, seeking guidance on whether he needs to switch anything

## 2022-04-13 NOTE — Telephone Encounter (Signed)
Pt called to inform MD that he just realized he mistakenly missed several days of the Amlodipine and thinks this was the reason for his headaches and dizziness.  Pt states he will start taking Rx as prescribed and will keep MD in the loop as to how it is going.  Pt states that, other than the dizziness and headaches, his BP has been great.

## 2022-04-13 NOTE — Telephone Encounter (Signed)
Patient informed of the message and verbalized understanding 

## 2022-04-13 NOTE — Telephone Encounter (Signed)
noted 

## 2022-04-16 ENCOUNTER — Telehealth: Payer: Self-pay | Admitting: Family Medicine

## 2022-04-16 NOTE — Telephone Encounter (Signed)
Providing blood pressure readings as directed-  8-22 136/90 ,  8-23 142/89,  8-24  123/80,   8-25   127/83

## 2022-04-16 NOTE — Telephone Encounter (Signed)
Patient informed of the message and verbalized understanding 

## 2022-05-01 ENCOUNTER — Other Ambulatory Visit: Payer: Self-pay | Admitting: Family Medicine

## 2022-05-01 DIAGNOSIS — I10 Essential (primary) hypertension: Secondary | ICD-10-CM

## 2022-05-03 NOTE — Telephone Encounter (Signed)
hydrochlorothiazide (HYDRODIURIL) 12.5 MG tablet patient is completely out. Pharmacy gave him 3 to get him thru the weekend.

## 2022-05-07 ENCOUNTER — Other Ambulatory Visit: Payer: Self-pay | Admitting: Family Medicine

## 2022-06-23 ENCOUNTER — Ambulatory Visit (INDEPENDENT_AMBULATORY_CARE_PROVIDER_SITE_OTHER): Payer: 59 | Admitting: Family Medicine

## 2022-06-23 ENCOUNTER — Encounter: Payer: Self-pay | Admitting: Family Medicine

## 2022-06-23 ENCOUNTER — Other Ambulatory Visit: Payer: Self-pay

## 2022-06-23 VITALS — BP 140/66 | HR 80 | Temp 97.4°F | Ht 73.23 in | Wt 299.7 lb

## 2022-06-23 DIAGNOSIS — Z0000 Encounter for general adult medical examination without abnormal findings: Secondary | ICD-10-CM

## 2022-06-23 DIAGNOSIS — R7303 Prediabetes: Secondary | ICD-10-CM

## 2022-06-23 LAB — CBC WITH DIFFERENTIAL/PLATELET
Basophils Absolute: 0 10*3/uL (ref 0.0–0.1)
Basophils Relative: 0.6 % (ref 0.0–3.0)
Eosinophils Absolute: 0.2 10*3/uL (ref 0.0–0.7)
Eosinophils Relative: 3.9 % (ref 0.0–5.0)
HCT: 49.1 % (ref 39.0–52.0)
Hemoglobin: 16.1 g/dL (ref 13.0–17.0)
Lymphocytes Relative: 33.9 % (ref 12.0–46.0)
Lymphs Abs: 1.9 10*3/uL (ref 0.7–4.0)
MCHC: 32.7 g/dL (ref 30.0–36.0)
MCV: 84.8 fl (ref 78.0–100.0)
Monocytes Absolute: 0.5 10*3/uL (ref 0.1–1.0)
Monocytes Relative: 9.6 % (ref 3.0–12.0)
Neutro Abs: 2.9 10*3/uL (ref 1.4–7.7)
Neutrophils Relative %: 52 % (ref 43.0–77.0)
Platelets: 157 10*3/uL (ref 150.0–400.0)
RBC: 5.79 Mil/uL (ref 4.22–5.81)
RDW: 14.3 % (ref 11.5–15.5)
WBC: 5.6 10*3/uL (ref 4.0–10.5)

## 2022-06-23 LAB — LIPID PANEL
Cholesterol: 114 mg/dL (ref 0–200)
HDL: 36.1 mg/dL — ABNORMAL LOW (ref >39.00)
LDL Cholesterol: 55 mg/dL (ref 0–99)
NonHDL: 77.89
Total CHOL/HDL Ratio: 3
Triglycerides: 115 mg/dL (ref 0.0–149.0)
VLDL: 23 mg/dL (ref 0.0–40.0)

## 2022-06-23 LAB — HEPATIC FUNCTION PANEL
ALT: 24 U/L (ref 0–53)
AST: 18 U/L (ref 0–37)
Albumin: 4.4 g/dL (ref 3.5–5.2)
Alkaline Phosphatase: 79 U/L (ref 39–117)
Bilirubin, Direct: 0.1 mg/dL (ref 0.0–0.3)
Total Bilirubin: 0.7 mg/dL (ref 0.2–1.2)
Total Protein: 7.2 g/dL (ref 6.0–8.3)

## 2022-06-23 LAB — HEMOGLOBIN A1C: Hgb A1c MFr Bld: 6.4 % (ref 4.6–6.5)

## 2022-06-23 LAB — BASIC METABOLIC PANEL
BUN: 19 mg/dL (ref 6–23)
CO2: 32 mEq/L (ref 19–32)
Calcium: 9.2 mg/dL (ref 8.4–10.5)
Chloride: 104 mEq/L (ref 96–112)
Creatinine, Ser: 0.91 mg/dL (ref 0.40–1.50)
GFR: 90.46 mL/min (ref >60.00)
Glucose, Bld: 122 mg/dL — ABNORMAL HIGH (ref 70–99)
Potassium: 4.2 mEq/L (ref 3.5–5.1)
Sodium: 141 mEq/L (ref 135–145)

## 2022-06-23 LAB — TSH: TSH: 1.19 u[IU]/mL (ref 0.35–5.50)

## 2022-06-23 NOTE — Progress Notes (Signed)
Established Patient Office Visit  Subjective   Patient ID: Paul [name], male    DOB: 23-Mar-1961  Age: 61 y.o. MRN: [id number]  Chief Complaint  Patient presents with   Annual Exam    HPI   Paul [name] seen for physical exam.  He has history of hypertension and we recently had added amlodipine 5 mg daily and is seen big improvement in blood pressures.  He also takes losartan and HCTZ.  He is on Crestor for hyperlipidemia.  He has had chronic back difficulties with 3 prior surgeries.  He helps care for his father who is 60 who lives with him and also his wife who is disabled on chronic oxygen secondary to COPD.  Health maintenance reviewed  Health Maintenance  Topic Date Due   HIV Screening  Never done   Zoster Vaccines- Shingrix (1 of 2) Never done   COVID-19 Vaccine (6 - Pfizer risk series) 07/09/2022   TETANUS/TDAP  06/02/2028   COLONOSCOPY (Pts 45-62yr Insurance coverage will need to be confirmed)  05/27/2030   INFLUENZA VACCINE  Completed   Hepatitis C Screening  Completed   HPV VACCINES  Aged Out   Social history-he has been married for 35 years.  Never smoked.  Rare alcohol use.  No children.  Has been disabled secondary to his chronic back issues for several years  Family history-mother died recently age 62  She was generally fairly healthy.  They are not sure but thinks she may have had some heart issues.  Half brother with Huntington's disease but this was not through the side of his family related to him biologically.  He has a sister who is alive and well.  Past Medical History:  Diagnosis Date   Allergy    seasonal allergies   Arthritis    on meds   Cancer ([place]    skin   Cervicalgia 12/17/2009   GERD 11/20/2008   on meds   HYPERLIPIDEMIA 11/20/2008   on meds   Hypertension    on meds   OBESITY 01/24/2009   PERIPHERAL VASCULAR DISEASE 01/24/2009   PREDIABETES 06/12/2010   Prostate CA ([place] 2021   dx 05/13/2020   RHINITIS 04/03/2009   SHOULDER PAIN 11/26/2009    Past Surgical History:  Procedure Laterality Date   BACK SURGERY  02-29-12   [place]    SHOULDER SURGERY Left    WISDOM TOOTH EXTRACTION      reports that he has never smoked. He has never used smokeless tobacco. He reports that he does not currently use alcohol after a past usage of about 1.0 standard drink of alcohol per week. He reports that he does not use drugs. family history includes Cancer in his paternal grandfather; Heart disease in his maternal uncle; Hyperlipidemia in his mother; Hypertension in his father and mother. Allergies  Allergen Reactions   Amoxicillin-Pot Clavulanate Diarrhea   Atorvastatin     REACTION: myalgia   Cephalexin Nausea Only and Nausea And Vomiting   Grass Pollen(K-O-R-T-Swt Vern)    Oxycodone Hcl Nausea Only    Review of Systems  Constitutional:  Negative for chills, fever, malaise/fatigue and weight loss.  HENT:  Negative for hearing loss.   Eyes:  Negative for blurred vision and double vision.  Respiratory:  Negative for cough and shortness of breath.   Cardiovascular:  Negative for chest pain, palpitations and leg swelling.  Gastrointestinal:  Negative for abdominal  pain, blood in stool, constipation and diarrhea.  Genitourinary:  Negative for dysuria.  Musculoskeletal:  Positive for back pain.  Skin:  Negative for rash.  Neurological:  Negative for dizziness, speech change, seizures, loss of consciousness, weakness and headaches.  Psychiatric/Behavioral:  Negative for depression.       Objective:     BP (!) 140/66 (BP Location: Left Arm, Patient Position: Sitting, Cuff Size: Large)   Pulse 80   Temp (!) 97.4 F (36.3 C) (Oral)   Ht 6' 1.23" (1.86 m)   Wt 299 lb 11.2 oz (135.9 kg)   SpO2 99%   BMI 39.29 kg/m  BP Readings from Last 3 Encounters:  06/23/22 (!) 140/66  03/29/22 138/70  03/15/22 (!) 174/88   Wt Readings from Last 3 Encounters:  06/23/22 299 lb 11.2 oz  (135.9 kg)  03/29/22 290 lb 14.4 oz (132 kg)  03/15/22 295 lb 1.6 oz (133.9 kg)      Physical Exam Vitals reviewed.  Constitutional:      General: He is not in acute distress.    Appearance: He is well-developed.  HENT:     Head: Normocephalic and atraumatic.     Right Ear: External ear normal.     Left Ear: External ear normal.  Eyes:     Conjunctiva/sclera: Conjunctivae normal.     Pupils: Pupils are equal, round, and reactive to light.  Neck:     Thyroid: No thyromegaly.  Cardiovascular:     Rate and Rhythm: Normal rate and regular rhythm.     Heart sounds: Normal heart sounds. No murmur heard. Pulmonary:     Effort: No respiratory distress.     Breath sounds: No wheezing or rales.  Abdominal:     General: Bowel sounds are normal. There is no distension.     Palpations: Abdomen is soft. There is no mass.     Tenderness: There is no abdominal tenderness. There is no guarding or rebound.  Musculoskeletal:     Cervical back: Normal range of motion and neck supple.     Right lower leg: No edema.     Left lower leg: No edema.  Lymphadenopathy:     Cervical: No cervical adenopathy.  Skin:    Findings: No rash.  Neurological:     Mental Status: He is alert and oriented to person, place, and time.     Cranial Nerves: No cranial nerve deficit.     Deep Tendon Reflexes: Reflexes normal.      No results found for any visits on 06/23/22.    The ASCVD Risk score (Arnett DK, et al., 2019) failed to calculate for the following reasons:   The valid total cholesterol range is 130 to 320 mg/dL    Assessment & Plan:   Problem List Items Addressed This Visit   None Visit Diagnoses     Physical exam    -  Primary   Relevant Orders   Lipid panel   Basic metabolic panel   CBC with Differential/Platelet   TSH   Hepatic function panel   Hemoglobin A1c     Paul [name] has a history of elevated PSA followed by urology.  He states they have scheduled MRI for further  evaluation.  -We discussed Shingrix vaccine and he will check on insurance coverage  -Set up screening labs as above.  Include A1c.  He had previous prediabetes range blood sugars in the past.  -Strongly encouraged to lose some weight and scale back sugars and  starches  -Continue annual flu vaccine  No follow-ups on file.    Carolann Littler, MD

## 2022-06-23 NOTE — Patient Instructions (Signed)
Consider Shingrix vaccine at some point.   Try to scale back calories and lose some weight

## 2022-06-28 ENCOUNTER — Other Ambulatory Visit: Payer: Self-pay | Admitting: Family Medicine

## 2022-06-28 DIAGNOSIS — I10 Essential (primary) hypertension: Secondary | ICD-10-CM

## 2022-07-21 ENCOUNTER — Other Ambulatory Visit: Payer: Self-pay | Admitting: Family Medicine

## 2022-09-09 ENCOUNTER — Other Ambulatory Visit: Payer: Self-pay | Admitting: Family Medicine

## 2022-09-09 DIAGNOSIS — I10 Essential (primary) hypertension: Secondary | ICD-10-CM

## 2022-10-12 ENCOUNTER — Other Ambulatory Visit: Payer: Self-pay | Admitting: Urology

## 2022-10-12 DIAGNOSIS — C61 Malignant neoplasm of prostate: Secondary | ICD-10-CM

## 2022-11-04 ENCOUNTER — Telehealth: Payer: Self-pay | Admitting: Family Medicine

## 2022-11-04 NOTE — Telephone Encounter (Signed)
Patient called asking for gel injections. Last given: 01/21/2021 Paul [name]  Can we run for authorization?

## 2022-11-05 NOTE — Telephone Encounter (Signed)
Gelsyn-3 VOB initiated for RIGHT knee OA.   Last series #1 - 01/07/21 #2 - 01/14/21 #3 - 01/21/21

## 2022-11-06 ENCOUNTER — Ambulatory Visit
Admission: RE | Admit: 2022-11-06 | Discharge: 2022-11-06 | Disposition: A | Discharge status: Home / Self Care | Payer: 59 | Source: Ambulatory Visit | Attending: Urology | Admitting: Urology

## 2022-11-06 DIAGNOSIS — C61 Malignant neoplasm of prostate: Secondary | ICD-10-CM

## 2022-11-06 MED ORDER — GADOPICLENOL 0.5 MMOL/ML IV SOLN
10.0000 mL | Freq: Once | INTRAVENOUS | Status: AC | PRN
  Administered 2022-11-06: 10 mL via INTRAVENOUS

## 2022-11-08 NOTE — Telephone Encounter (Signed)
Pt is ready for scheduling - Gelsyn-3 RIGHT knee OA  Primary: U[employer] Choice Plus POS Copay: $50 Co-insurance: 20% after deductible has been satisfied Deductible: $0 of $500 met Prior Auth: NOT required

## 2022-11-08 NOTE — Telephone Encounter (Addendum)
Patient has a [place] with an effective date of 08/23/2022. Plan follow UHC Guidelines.  GELSYN-3 [id number] and administration 20610/20611 are covered at 80 % of allowable amount after the deductible has been met. Specialist office visits if billed are covered at 100% of the allowable after a $50 copay. If out of pocket is met, coverage goes to 100% and copay will no longer apply.  No pre-cert or referrals needed.  Medical notes must be submitted with the claim. Include medical necessity, diagnosis, and all clinicals.  As per Rep Dr. Yehuda Savannah is in network with this provided [id number].  Ref# [id number]

## 2022-11-09 NOTE — Telephone Encounter (Signed)
Left message for patient to call back to schedule.  °

## 2022-11-11 NOTE — Progress Notes (Signed)
   I, Josepha Pigg, CMA acting as a scribe for Lynne Leader, MD.  Paul [name] is a 62 y.o. male who presents to [place] at [facility] today for reoccurring right knee pain.  Patient was last seen by Dr. Georgina Snell on 01/21/2021 completing the Gelsyn series, 3/3 in his right knee.  Patient called the office on 11/04/2022 requesting reauthorization of gel shots.  Today, patient reports 6 months of relief s/p last inj. Over the past couple of months has started to notice some swelling around the knee, mechanical sx with squatting and a new pain at the posterolateral aspect of the knee. Has been wearing compression brace and taking Tylenol prn with minimal relief. 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  He is also taking care of his dad who is a widower after being married for 45 years.   Pertinent review of systems: No fevers or chills.  Relevant historical information: Hypertension.  Prostate cancer history.   Exam:  BP (!) 140/78   Pulse 96   Ht 6' 1.25" (1.861 m)   Wt 294 lb 9.6 oz (133.6 kg)   SpO2 97%   BMI 38.60 kg/m  General: Well Developed, well nourished, and in no acute distress.   MSK: Right knee moderate effusion normal motion Intact strength.  Tender palpation lateral joint line.    Lab and Radiology Results  Gelsyn injection right knee 1/3 Procedure: Real-time Ultrasound Guided Injection of right knee joint superior lateral patellar space Device: Philips Affiniti 50G Images permanently stored and available for review in PACS Verbal informed consent obtained.  Discussed risks and benefits of procedure. Warned about infection, bleeding, damage to structures among others. Patient expresses understanding and agreement Time-out conducted.   Noted no overlying erythema, induration, or other signs of local infection.   Skin prepped in a sterile fashion.   Local anesthesia: Topical Ethyl chloride.   With sterile technique and under real time  ultrasound guidance: Gelsyn 2 mL injected into knee joint. Fluid seen entering the joint capsule.   Completed without difficulty   Advised to call if fevers/chills, erythema, induration, drainage, or persistent bleeding.   Images permanently stored and available for review in the ultrasound unit.  Impression: Technically successful ultrasound guided injection. Lot number: [id number]        Assessment and Plan: 62 y.o. male with right knee pain due to exacerbation of DJD.  This is an acute exacerbation of a chronic problem.  He has done well historically with hyaluronic acid injections.  Plan to restart hyaluronic acid series today with Gelsyn injection 1/3.  Recheck in a little over 1 week for Gelsyn injection 2/3.   PDMP not reviewed this encounter. Orders Placed This Encounter  Procedures   [place] LIMITED JOINT SPACE STRUCTURES LOW RIGHT(NO LINKED CHARGES)    Order Specific Question:   Reason for Exam (SYMPTOM  OR DIAGNOSIS REQUIRED)    Answer:   right knee pain    Order Specific Question:   Preferred imaging location?    Answer:   [place]   Meds ordered this encounter  Medications   sodium hyaluronate (viscosup) (GELSYN-3) intra-articular injection 16.8 mg     Discussed warning signs or symptoms. Please see discharge instructions. Patient expresses understanding.   The above documentation has been reviewed and is accurate and complete Lynne Leader, M.D.

## 2022-11-12 ENCOUNTER — Encounter: Payer: Self-pay | Admitting: Family Medicine

## 2022-11-12 ENCOUNTER — Ambulatory Visit: Payer: 59 | Admitting: Family Medicine

## 2022-11-12 ENCOUNTER — Other Ambulatory Visit: Payer: Self-pay

## 2022-11-12 VITALS — BP 140/78 | HR 96 | Ht 73.25 in | Wt 294.6 lb

## 2022-11-12 DIAGNOSIS — M1711 Unilateral primary osteoarthritis, right knee: Secondary | ICD-10-CM

## 2022-11-12 DIAGNOSIS — M25561 Pain in right knee: Secondary | ICD-10-CM

## 2022-11-12 DIAGNOSIS — G8929 Other chronic pain: Secondary | ICD-10-CM | POA: Diagnosis not present

## 2022-11-12 MED ORDER — SODIUM HYALURONATE (VISCOSUP) 16.8 MG/2ML IX SOSY
16.8000 mg | PREFILLED_SYRINGE | Freq: Once | INTRA_ARTICULAR | Status: AC
  Administered 2022-11-12: 16.8 mg via INTRA_ARTICULAR

## 2022-11-12 NOTE — Patient Instructions (Signed)
Thank you for coming in today.   Ok to reschedule the next two gel shots.  Next one could be the first week of April and the 3rd shot could the week after.

## 2022-11-14 ENCOUNTER — Other Ambulatory Visit: Payer: Self-pay | Admitting: Family Medicine

## 2022-11-18 ENCOUNTER — Ambulatory Visit: Payer: 59 | Admitting: Sports Medicine

## 2022-11-22 ENCOUNTER — Ambulatory Visit: Payer: 59 | Admitting: Family Medicine

## 2022-11-22 ENCOUNTER — Other Ambulatory Visit: Payer: Self-pay

## 2022-11-22 DIAGNOSIS — M25561 Pain in right knee: Secondary | ICD-10-CM | POA: Diagnosis not present

## 2022-11-22 DIAGNOSIS — M1711 Unilateral primary osteoarthritis, right knee: Secondary | ICD-10-CM

## 2022-11-22 DIAGNOSIS — G8929 Other chronic pain: Secondary | ICD-10-CM | POA: Diagnosis not present

## 2022-11-22 MED ORDER — SODIUM HYALURONATE (VISCOSUP) 16.8 MG/2ML IX SOSY
16.8000 mg | PREFILLED_SYRINGE | Freq: Once | INTRA_ARTICULAR | Status: AC
  Administered 2022-11-22: 16.8 mg via INTRA_ARTICULAR

## 2022-11-22 NOTE — Patient Instructions (Signed)
Thank you for coming in today.   You received an injection today. Seek immediate medical attention if the joint becomes red, extremely painful, or is oozing fluid.   We will see you next week for the 3rd Gelsyn injection 

## 2022-11-22 NOTE — Progress Notes (Signed)
Paul [name] presents to clinic today for Gelsyn injection right knee 2/3  Procedure: Real-time Ultrasound Guided Injection of right knee joint superior lateral patellar space Device: Philips Affiniti 50G Images permanently stored and available for review in PACS Verbal informed consent obtained.  Discussed risks and benefits of procedure. Warned about infection, bleeding, damage to structures among others. Patient expresses understanding and agreement Time-out conducted.   Noted no overlying erythema, induration, or other signs of local infection.   Skin prepped in a sterile fashion.   Local anesthesia: Topical Ethyl chloride.   With sterile technique and under real time ultrasound guidance: Gelsyn 2 mL injected into the joint. Fluid seen entering the joint capsule.   Completed without difficulty   Advised to call if fevers/chills, erythema, induration, drainage, or persistent bleeding.   Images permanently stored and available for review in the ultrasound unit.  Impression: Technically successful ultrasound guided injection.  Lot number: [id number] Return in 1 week for Gelsyn injection right knee 3/3

## 2022-11-23 ENCOUNTER — Encounter: Payer: Self-pay | Admitting: Internal Medicine

## 2022-11-23 ENCOUNTER — Telehealth (INDEPENDENT_AMBULATORY_CARE_PROVIDER_SITE_OTHER): Payer: 59 | Admitting: Internal Medicine

## 2022-11-23 VITALS — BP 127/82 | Temp 97.1°F | Ht 72.0 in | Wt 281.0 lb

## 2022-11-23 DIAGNOSIS — U071 COVID-19: Secondary | ICD-10-CM

## 2022-11-23 MED ORDER — NIRMATRELVIR/RITONAVIR (PAXLOVID)TABLET
ORAL_TABLET | ORAL | 0 refills | Status: DC
Start: 2022-11-23 — End: 2023-06-29

## 2022-11-23 NOTE — Progress Notes (Signed)
Virtual Visit via Video Note  I connected with Paul [name] on 11/23/22 at  3:45 PM EDT by a video enabled telemedicine application and verified that I am speaking with the correct person using two identifiers. Location patient: home Location provider:work office Persons participating in the virtual visit: patient, provider   Patient aware  of the limitations of evaluation and management by telemedicine and  availability of in person appointments. and agreed to proceed.   HPI: Paul [name] presents for video visit  because of onset ur congestion cold like sx with sinus pressure  minor cough feverish 100.9 now gone   onset March 31  . Tested pos for covid 19 yesterday.  No one else sick  wife with copd and  80s+ father in [place] . Wishes advice  has had 4 covid vaccines.  NO cp sob  bleeding syncope .  Needs advice and sib suggested he consider paxlovid  ROS: See pertinent positives and negatives per HPI.  Past Medical History:  Diagnosis Date   Allergy    seasonal allergies   Arthritis    on meds   Cancer    skin   Cervicalgia 12/17/2009   GERD 11/20/2008   on meds   HYPERLIPIDEMIA 11/20/2008   on meds   Hypertension    on meds   OBESITY 01/24/2009   PERIPHERAL VASCULAR DISEASE 01/24/2009   PREDIABETES 06/12/2010   Prostate CA 2021   dx 05/13/2020   RHINITIS 04/03/2009   SHOULDER PAIN 11/26/2009    Past Surgical History:  Procedure Laterality Date   BACK SURGERY  02-29-12   lumbar fusion per Dr. Rennis Harding in [place] Right    SHOULDER SURGERY Left    WISDOM TOOTH EXTRACTION      Family History  Problem Relation Age of Onset   Hypertension Mother    Hyperlipidemia Mother    Hypertension Father    Heart disease Maternal Uncle    Cancer Paternal Grandfather        prostate cancer   Colon polyps Neg Hx    Colon cancer Neg Hx    Esophageal cancer Neg Hx    Stomach cancer Neg Hx    Rectal cancer Neg Hx     Social History   Tobacco Use   Smoking  status: Never   Smokeless tobacco: Never  Vaping Use   Vaping Use: Never used  Substance Use Topics   Alcohol use: Not Currently    Alcohol/week: 1.0 standard drink of alcohol    Types: 1 Glasses of wine per week   Drug use: No      Current Outpatient Medications:    amLODipine (NORVASC) 5 MG tablet, Take 1 tablet (5 mg total) by mouth daily., Disp: 90 tablet, Rfl: 3   aspirin 81 MG tablet, Take 81 mg by mouth daily.  , Disp: , Rfl:    Cholecalciferol (VITAMIN D3) 50 MCG (2000 UT) TABS, Take by mouth., Disp: , Rfl:    esomeprazole (NEXIUM) 20 MG capsule, Take 20 mg by mouth daily at 12 noon., Disp: , Rfl:    fexofenadine (ALLEGRA) 180 MG tablet, Take 180 mg by mouth daily.  , Disp: , Rfl:    fluticasone (FLONASE) 50 MCG/ACT nasal spray, USE 2 SPRAYS INTO EACH NOSTRIL DAILY, Disp: 16 g, Rfl: 3   hydrochlorothiazide (HYDRODIURIL) 12.5 MG tablet, TAKE 1 TABLET BY MOUTH EVERY DAY, Disp: 90 tablet, Rfl: 2   losartan (COZAAR) 50 MG  tablet, TAKE 2 TABLETS BY MOUTH EVERY DAY, Disp: 60 tablet, Rfl: 3   nirmatrelvir/ritonavir (PAXLOVID) 20 x 150 MG & 10 x 100MG  TABS, Patient GFR is 90  Take nirmatrelvir (150 mg) 2 tablet(s) twice daily for 5 days and ritonavir (100 mg) one tablet twice daily for 5 days.take  Medication together., Disp: 30 tablet, Rfl: 0   rosuvastatin (CRESTOR) 10 MG tablet, TAKE 1/2 TABLET BY MOUTH EVERY DAY, Disp: 15 tablet, Rfl: 5   sildenafil (REVATIO) 20 MG tablet, TAKE 2 TO 5 TABLETS AS NEEDED, Disp: 15 tablet, Rfl: 4   gabapentin (NEURONTIN) 100 MG capsule, TAKE 1 CAPSULE BY MOUTH TWICE A DAY (Patient not taking: Reported on 11/12/2022), Disp: 60 capsule, Rfl: 2  EXAM: BP Readings from Last 3 Encounters:  11/23/22 127/82  11/12/22 (!) 140/78  06/23/22 (!) 140/66    VITALS per patient if applicable:  GENERAL: alert, oriented, appears well and in no acute distress upper congestion  mildly hoarse   rare cough non toxic  and no resp distress   HEENT: atraumatic,  conjunttiva clear, no obvious abnormalities on inspection of external nose and ears  NECK: normal movements of the head and neck  LUNGS: on inspection no signs of respiratory distress, breathing rate appears normal, no obvious gross SOB, gasping or wheezing  CV: no obvious cyanosis  MS: moves all visible extremities without noticeable abnormality  PSYCH/NEURO: pleasant and cooperative, no obvious depression or anxiety, speech and thought processing grossly intact Lab Results  Component Value Date   WBC 5.6 06/23/2022   HGB 16.1 06/23/2022   HCT 49.1 06/23/2022   PLT 157.0 06/23/2022   GLUCOSE 122 (H) 06/23/2022   CHOL 114 06/23/2022   TRIG 115.0 06/23/2022   HDL 36.10 (L) 06/23/2022   LDLCALC 55 06/23/2022   ALT 24 06/23/2022   AST 18 06/23/2022   NA 141 06/23/2022   K 4.2 06/23/2022   CL 104 06/23/2022   CREATININE 0.91 06/23/2022   BUN 19 06/23/2022   CO2 32 06/23/2022   TSH 1.19 06/23/2022   PSA 3.42 06/04/2019   HGBA1C 6.4 06/23/2022    ASSESSMENT AND PLAN:  Discussed the following assessment and plan:    ICD-10-CM   1. COVID-19 virus infection  U07.1     Immunized x 4   family no sx  Options to use  paxlovid with rikjs of underlying conditions   hld age other  pvd  risk benefit  disc  may be low with hx of immunizations  and not immunosuppressed . If gets side effects  can dc  fu with alarm sx  .   Expectant management.  ?s answered disc as possible Counseled.   Expectant management and discussion of plan and treatment with opportunity to ask questions and all were answered. The patient agreed with the plan and demonstrated an understanding of the instructions.   Advised to call back or seek an in-person evaluation if worsening  or having  further concerns  in interim. Return if symptoms worsen or fail to improve.    Shanon Ace, MD

## 2022-11-26 ENCOUNTER — Ambulatory Visit: Payer: 59 | Admitting: Family Medicine

## 2022-11-29 ENCOUNTER — Other Ambulatory Visit: Payer: Self-pay

## 2022-11-29 ENCOUNTER — Ambulatory Visit: Payer: 59 | Admitting: Family Medicine

## 2022-11-29 DIAGNOSIS — M25561 Pain in right knee: Secondary | ICD-10-CM | POA: Diagnosis not present

## 2022-11-29 DIAGNOSIS — M1711 Unilateral primary osteoarthritis, right knee: Secondary | ICD-10-CM

## 2022-11-29 DIAGNOSIS — G8929 Other chronic pain: Secondary | ICD-10-CM | POA: Diagnosis not present

## 2022-11-29 MED ORDER — SODIUM HYALURONATE (VISCOSUP) 16.8 MG/2ML IX SOSY
16.8000 mg | PREFILLED_SYRINGE | Freq: Once | INTRA_ARTICULAR | Status: AC
Start: 2022-11-29 — End: 2022-11-29
  Administered 2022-11-29: 16.8 mg via INTRA_ARTICULAR

## 2022-11-29 NOTE — Patient Instructions (Signed)
Thank you for coming in today.   You received an injection today. Seek immediate medical attention if the joint becomes red, extremely painful, or is oozing fluid.   Check back as needed 

## 2022-11-29 NOTE — Progress Notes (Signed)
Neo presents to clinic today for Gelsyn injection right knee 3/3  Procedure: Real-time Ultrasound Guided Injection of right knee joint superior lateral patellar space Device: Philips Affiniti 50G Images permanently stored and available for review in PACS Verbal informed consent obtained.  Discussed risks and benefits of procedure. Warned about infection, bleeding, damage to structures among others. Patient expresses understanding and agreement Time-out conducted.   Noted no overlying erythema, induration, or other signs of local infection.   Skin prepped in a sterile fashion.   Local anesthesia: Topical Ethyl chloride.   With sterile technique and under real time ultrasound guidance: Gelsyn 2 mL mg injected into knee joint. Fluid seen entering the joint capsule.   Completed without difficulty   Advised to call if fevers/chills, erythema, induration, drainage, or persistent bleeding.   Images permanently stored and available for review in the ultrasound unit.  Impression: Technically successful ultrasound guided injection. Lot number: [id number]  Return as needed

## 2022-11-29 NOTE — Telephone Encounter (Signed)
Gelsyn-3 RIGHT knee OA  #1 11/12/22 #2 11/22/22 #3 11/29/22  Can consider repeat 06/01/23

## 2022-12-03 ENCOUNTER — Telehealth: Payer: Self-pay | Admitting: Family Medicine

## 2022-12-03 ENCOUNTER — Telehealth: Payer: 59 | Admitting: Physician Assistant

## 2022-12-03 DIAGNOSIS — J019 Acute sinusitis, unspecified: Secondary | ICD-10-CM

## 2022-12-03 DIAGNOSIS — B9689 Other specified bacterial agents as the cause of diseases classified elsewhere: Secondary | ICD-10-CM

## 2022-12-03 MED ORDER — DOXYCYCLINE HYCLATE 100 MG PO TABS
100.0000 mg | ORAL_TABLET | Freq: Two times a day (BID) | ORAL | 0 refills | Status: DC
Start: 2022-12-03 — End: 2023-06-29

## 2022-12-03 NOTE — Telephone Encounter (Signed)
Spoke to pt. Pt reports his head, ears are clogged up  and ears are ringing from blowing up his nose. Feels congestion has worsen. Thicker green mucus. Inform pt Dr. Fabian Sharp is out for the week and will be on Tuesday. Message will forward to another provider who can cover Dr. Fabian Sharp while she is out.  Advise pt if he is feeling worse, he can head to a nearby urgent care. Verbalized understanding.

## 2022-12-03 NOTE — Progress Notes (Signed)
E-Visit for Sinus Problems  We are sorry that you are not feeling well.  Here is how we plan to help!  Based on what you have shared with me it looks like you have sinusitis.  Sinusitis is inflammation and infection in the sinus cavities of the head.  Based on your presentation I believe you most likely have Acute Bacterial Sinusitis.  This is an infection caused by bacteria and is treated with antibiotics. I have prescribed Doxycycline 100mg by mouth twice a day for 10 days. You may use an oral decongestant such as Mucinex D or if you have glaucoma or high blood pressure use plain Mucinex. Saline nasal spray help and can safely be used as often as needed for congestion.  If you develop worsening sinus pain, fever or notice severe headache and vision changes, or if symptoms are not better after completion of antibiotic, please schedule an appointment with a health care provider.    Sinus infections are not as easily transmitted as other respiratory infection, however we still recommend that you avoid close contact with loved ones, especially the very [place] and elderly.  Remember to wash your hands thoroughly throughout the day as this is the number one way to prevent the spread of infection!  Home Care: Only take medications as instructed by your medical team. Complete the entire course of an antibiotic. Do not take these medications with alcohol. A steam or ultrasonic humidifier can help congestion.  You can place a towel over your head and breathe in the steam from hot water coming from a faucet. Avoid close contacts especially the very [place] and the elderly. Cover your mouth when you cough or sneeze. Always remember to wash your hands.  Get Help Right Away If: You develop worsening fever or sinus pain. You develop a severe head ache or visual changes. Your symptoms persist after you have completed your treatment plan.  Make sure you Understand these instructions. Will watch your  condition. Will get help right away if you are not doing well or get worse.  Thank you for choosing an e-visit.  Your e-visit answers were reviewed by a board certified advanced clinical practitioner to complete your personal care plan. Depending upon the condition, your plan could have included both over the counter or prescription medications.  Please review your pharmacy choice. Make sure the pharmacy is open so you can pick up prescription now. If there is a problem, you may contact your provider through MyChart messaging and have the prescription routed to another pharmacy.  Your safety is important to us. If you have drug allergies check your prescription carefully.   For the next 24 hours you can use MyChart to ask questions about today's visit, request a non-urgent call back, or ask for a work or school excuse. You will get an email in the next two days asking about your experience. I hope that your e-visit has been valuable and will speed your recovery.  I have spent 5 minutes in review of e-visit questionnaire, review and updating patient chart, medical decision making and response to patient.   Othon Guardia M Juwuan Sedita, PA-C  

## 2022-12-03 NOTE — Telephone Encounter (Signed)
Pt was last seen on 11/23/22 (VV) by Dr. Fabian Sharp.  Pt states he is still very congested and has a lot of thick green mucus.  Pt is asking for a stronger Rx to treat these symptoms.  Please advise.  [id number] - [place], [place] - [address] AT [place] [address] Phone: [phone]  Fax: [phone]

## 2022-12-03 NOTE — Telephone Encounter (Signed)
Spoke with pt advised of Paul [name] recommendation, pt verbalized understanding

## 2022-12-16 ENCOUNTER — Other Ambulatory Visit: Payer: Self-pay | Admitting: Family Medicine

## 2022-12-25 ENCOUNTER — Other Ambulatory Visit: Payer: Self-pay | Admitting: Family Medicine

## 2022-12-25 DIAGNOSIS — I10 Essential (primary) hypertension: Secondary | ICD-10-CM

## 2022-12-27 ENCOUNTER — Other Ambulatory Visit (INDEPENDENT_AMBULATORY_CARE_PROVIDER_SITE_OTHER): Payer: 59

## 2022-12-27 DIAGNOSIS — R7303 Prediabetes: Secondary | ICD-10-CM | POA: Diagnosis not present

## 2022-12-27 LAB — HEMOGLOBIN A1C: Hgb A1c MFr Bld: 6.2 % (ref 4.6–6.5)

## 2022-12-27 NOTE — Addendum Note (Signed)
Addended by: Marian Sorrow D on: 12/27/2022 08:42 AM   Modules accepted: Orders

## 2023-03-30 ENCOUNTER — Other Ambulatory Visit: Payer: Self-pay | Admitting: Family Medicine

## 2023-04-03 ENCOUNTER — Other Ambulatory Visit: Payer: Self-pay | Admitting: Family Medicine

## 2023-04-04 ENCOUNTER — Other Ambulatory Visit: Payer: Self-pay | Admitting: Family Medicine

## 2023-04-04 DIAGNOSIS — I10 Essential (primary) hypertension: Secondary | ICD-10-CM

## 2023-05-02 NOTE — Telephone Encounter (Signed)
 VOB initiated for GELSYN for RIGHT knee OA.

## 2023-05-03 ENCOUNTER — Other Ambulatory Visit: Payer: Self-pay | Admitting: Family Medicine

## 2023-05-03 NOTE — Telephone Encounter (Signed)
GELSYN for RIGHT knee OA OK to schedule on or after 06/01/23  Primary Insurance: [facility] [employer] Co-pay: $50 per injection Co-insurance: 20% Deductible: $0 of $500 met, must be met for coverage to apply Prior Auth NOT required    Knee Injection History: 04/14/20 - Kenalog RIGHT 05/13/20 - Gelsyn #1 RIGHT 05/19/20 - Gelsyn #2 RIGHT 05/26/20 - Gelsyn #3 RIGHT 01/07/21 - Unk Lightning #1 RIGHT 01/14/21 - Unk Lightning #2 RIGHT 01/21/21 - Unk Lightning #3 RIGHT 11/12/22 - Gelsyn #1 RIGHT 11/22/22 - Gelsyn #2 RIGHT 11/29/22 - Gelsyn #3 RIGHT

## 2023-05-03 NOTE — Telephone Encounter (Signed)
NO Prior Auth required for [employer]

## 2023-05-03 NOTE — Telephone Encounter (Signed)
Holding until needed.

## 2023-05-04 ENCOUNTER — Other Ambulatory Visit: Payer: Self-pay | Admitting: Family Medicine

## 2023-05-04 DIAGNOSIS — I10 Essential (primary) hypertension: Secondary | ICD-10-CM

## 2023-05-20 ENCOUNTER — Other Ambulatory Visit: Payer: Self-pay | Admitting: *Deleted

## 2023-05-20 DIAGNOSIS — I739 Peripheral vascular disease, unspecified: Secondary | ICD-10-CM

## 2023-05-23 ENCOUNTER — Telehealth: Payer: Self-pay | Admitting: Family Medicine

## 2023-05-23 NOTE — Telephone Encounter (Signed)
Polyps were found at colonoscopy 3 years ago, advised to return in 3 years for colonoscopy, asking does he need a referral for diagnostic

## 2023-05-24 NOTE — Telephone Encounter (Signed)
He can just call the GI office to set this up. No need for a referral.

## 2023-05-24 NOTE — Telephone Encounter (Signed)
Patient informed of the message below and voiced understanding  

## 2023-05-26 ENCOUNTER — Encounter: Payer: Self-pay | Admitting: Gastroenterology

## 2023-06-01 ENCOUNTER — Other Ambulatory Visit: Payer: Self-pay | Admitting: Family Medicine

## 2023-06-01 ENCOUNTER — Encounter (HOSPITAL_COMMUNITY): Payer: 59

## 2023-06-01 ENCOUNTER — Ambulatory Visit: Payer: 59

## 2023-06-11 ENCOUNTER — Other Ambulatory Visit: Payer: Self-pay | Admitting: Family Medicine

## 2023-06-11 DIAGNOSIS — I10 Essential (primary) hypertension: Secondary | ICD-10-CM

## 2023-06-25 ENCOUNTER — Other Ambulatory Visit: Payer: Self-pay | Admitting: Family Medicine

## 2023-06-28 ENCOUNTER — Other Ambulatory Visit: Payer: Self-pay | Admitting: Family Medicine

## 2023-06-28 DIAGNOSIS — I10 Essential (primary) hypertension: Secondary | ICD-10-CM

## 2023-06-29 ENCOUNTER — Encounter: Payer: Self-pay | Admitting: Family Medicine

## 2023-06-29 ENCOUNTER — Ambulatory Visit (INDEPENDENT_AMBULATORY_CARE_PROVIDER_SITE_OTHER): Payer: 59 | Admitting: Family Medicine

## 2023-06-29 VITALS — BP 140/70 | HR 80 | Temp 98.5°F | Ht 73.23 in | Wt 280.9 lb

## 2023-06-29 DIAGNOSIS — R7303 Prediabetes: Secondary | ICD-10-CM

## 2023-06-29 DIAGNOSIS — E785 Hyperlipidemia, unspecified: Secondary | ICD-10-CM

## 2023-06-29 DIAGNOSIS — Z0000 Encounter for general adult medical examination without abnormal findings: Secondary | ICD-10-CM

## 2023-06-29 DIAGNOSIS — I10 Essential (primary) hypertension: Secondary | ICD-10-CM | POA: Diagnosis not present

## 2023-06-29 LAB — CBC WITH DIFFERENTIAL/PLATELET
Basophils Absolute: 0 10*3/uL (ref 0.0–0.1)
Basophils Relative: 0.5 % (ref 0.0–3.0)
Eosinophils Absolute: 0.2 10*3/uL (ref 0.0–0.7)
Eosinophils Relative: 3 % (ref 0.0–5.0)
HCT: 50.3 % (ref 39.0–52.0)
Hemoglobin: 16.3 g/dL (ref 13.0–17.0)
Lymphocytes Relative: 30.9 % (ref 12.0–46.0)
Lymphs Abs: 1.7 10*3/uL (ref 0.7–4.0)
MCHC: 32.5 g/dL (ref 30.0–36.0)
MCV: 84.8 fL (ref 78.0–100.0)
Monocytes Absolute: 0.5 10*3/uL (ref 0.1–1.0)
Monocytes Relative: 9.4 % (ref 3.0–12.0)
Neutro Abs: 3.1 10*3/uL (ref 1.4–7.7)
Neutrophils Relative %: 56.2 % (ref 43.0–77.0)
Platelets: 192 10*3/uL (ref 150.0–400.0)
RBC: 5.93 Mil/uL — ABNORMAL HIGH (ref 4.22–5.81)
RDW: 14.7 % (ref 11.5–15.5)
WBC: 5.5 10*3/uL (ref 4.0–10.5)

## 2023-06-29 LAB — HEPATIC FUNCTION PANEL
ALT: 23 U/L (ref 0–53)
AST: 18 U/L (ref 0–37)
Albumin: 4.4 g/dL (ref 3.5–5.2)
Alkaline Phosphatase: 75 U/L (ref 39–117)
Bilirubin, Direct: 0.1 mg/dL (ref 0.0–0.3)
Total Bilirubin: 0.7 mg/dL (ref 0.2–1.2)
Total Protein: 7.1 g/dL (ref 6.0–8.3)

## 2023-06-29 LAB — LIPID PANEL
Cholesterol: 112 mg/dL (ref 0–200)
HDL: 39.7 mg/dL (ref >39.00)
LDL Cholesterol: 49 mg/dL (ref 0–99)
NonHDL: 71.96
Total CHOL/HDL Ratio: 3
Triglycerides: 114 mg/dL (ref 0.0–149.0)
VLDL: 22.8 mg/dL (ref 0.0–40.0)

## 2023-06-29 LAB — BASIC METABOLIC PANEL
BUN: 17 mg/dL (ref 6–23)
CO2: 31 meq/L (ref 19–32)
Calcium: 9.4 mg/dL (ref 8.4–10.5)
Chloride: 104 meq/L (ref 96–112)
Creatinine, Ser: 1.03 mg/dL (ref 0.40–1.50)
GFR: 77.41 mL/min (ref >60.00)
Glucose, Bld: 125 mg/dL — ABNORMAL HIGH (ref 70–99)
Potassium: 4.2 meq/L (ref 3.5–5.1)
Sodium: 141 meq/L (ref 135–145)

## 2023-06-29 LAB — HEMOGLOBIN A1C: Hgb A1c MFr Bld: 6.3 % (ref 4.6–6.5)

## 2023-06-29 MED ORDER — AMLODIPINE BESYLATE 10 MG PO TABS: 10.0000 mg | ORAL_TABLET | Freq: Every day | ORAL | 3 refills | Status: DC

## 2023-06-29 NOTE — Patient Instructions (Signed)
Increase Amlodipine to 10 mg daily  Monitor blood pressure intermittently

## 2023-06-29 NOTE — Progress Notes (Signed)
Established Patient Office Visit  Subjective   Patient ID: Paul [name], male    DOB: 1961-05-13  Age: 62 y.o. MRN: [id number]  Chief Complaint  Patient presents with   Annual Exam    HPI   Paul [name] is seen today for physical exam.  Dealing with tremendous stress issues with his wife's had multiple recent hospitalizations and is currently hospitalized with congestive heart failure and COPD.  He has also been actively involved to take care of his 66 year old father who no longer drives.  Paul [name] has history of hypertension, peripheral vascular disease, prostate cancer, hyperlipidemia, prediabetes.  He is made some dietary changes and has lost some weight of about 18 to 20 pounds since last year.  Last A1c 6.2%.  Remains on rosuvastatin, losartan, amlodipine, and HCTZ.  Not monitoring blood pressure recently at home.  He has significant osteoarthritis involving specially right knee.  Has had injections in the knee this past year with minimal relief.  Holding off on knee replacement surgery because his wife's illnesses  Health maintenance reviewed:  Health Maintenance  Topic Date Due   HIV Screening  Never done   COVID-19 Vaccine (6 - 2023-24 season) 04/24/2023   Zoster Vaccines- Shingrix (1 of 2) 09/29/2023 (Originally [birth year]/1979)   DTaP/Tdap/Td (4 - Td or Tdap) 06/02/2028   Colonoscopy  05/27/2030   INFLUENZA VACCINE  Completed   Hepatitis C Screening  Completed   HPV VACCINES  Aged Out    Review of Systems  Constitutional:  Negative for chills, fever and malaise/fatigue.  Eyes:  Negative for blurred vision.  Respiratory:  Negative for shortness of breath.   Cardiovascular:  Negative for chest pain.  Gastrointestinal:  Negative for abdominal pain.  Genitourinary:  Negative for dysuria.  Neurological:  Negative for dizziness, weakness and headaches.      Objective:     BP (!) 140/70 (BP Location: Left Arm, Patient Position: Sitting, Cuff Size: Large)   Pulse 80   Temp 98.5  F (36.9 C) (Oral)   Ht 6' 1.23" (1.86 m)   Wt 280 lb 14.4 oz (127.4 kg)   SpO2 98%   BMI 36.83 kg/m  BP Readings from Last 3 Encounters:  06/29/23 (!) 140/70  11/23/22 127/82  11/12/22 (!) 140/78   Wt Readings from Last 3 Encounters:  06/29/23 280 lb 14.4 oz (127.4 kg)  11/23/22 281 lb (127.5 kg)  11/12/22 294 lb 9.6 oz (133.6 kg)      Physical Exam Vitals reviewed.  Constitutional:      General: He is not in acute distress.    Appearance: He is well-developed. He is not ill-appearing.  HENT:     Head: Normocephalic and atraumatic.     Right Ear: External ear normal.     Left Ear: External ear normal.  Eyes:     Conjunctiva/sclera: Conjunctivae normal.     Pupils: Pupils are equal, round, and reactive to light.  Neck:     Thyroid: No thyromegaly.  Cardiovascular:     Rate and Rhythm: Normal rate and regular rhythm.     Heart sounds: Normal heart sounds. No murmur heard. Pulmonary:     Effort: Pulmonary effort is normal. No respiratory distress.     Breath sounds: Normal breath sounds. No wheezing or rales.  Abdominal:     General: Bowel sounds are normal. There is no distension.     Palpations: Abdomen is soft. There is no mass.     Tenderness: There is no abdominal  tenderness. There is no guarding or rebound.  Musculoskeletal:     Cervical back: Normal range of motion and neck supple.     Right lower leg: No edema.     Left lower leg: No edema.  Lymphadenopathy:     Cervical: No cervical adenopathy.  Skin:    Findings: No rash.  Neurological:     Mental Status: He is alert and oriented to person, place, and time.     Cranial Nerves: No cranial nerve deficit.      No results found for any visits on 06/29/23.    The ASCVD Risk score (Arnett DK, et al., 2019) failed to calculate for the following reasons:   The valid total cholesterol range is 130 to 320 mg/dL    Assessment & Plan:   Problem List Items Addressed This Visit       Unprioritized    Prediabetes   Relevant Orders   Hemoglobin A1c   Hyperlipidemia   Relevant Medications   amLODipine (NORVASC) 10 MG tablet   Other Relevant Orders   Lipid panel   Hepatic function panel   Hypertension   Relevant Medications   amLODipine (NORVASC) 10 MG tablet   Other Relevant Orders   Basic metabolic panel   Other Visit Diagnoses     Physical exam    -  Primary   Relevant Orders   CBC with Differential/Platelet     62 year old male here for physical today.  Blood pressure suboptimally controlled.  Increase amlodipine to 10 mg daily and continue current dose of losartan 100 mg daily and HCTZ 25 mg daily.  Set up 12-month follow-up.  -Flu vaccine already given -Colonoscopy up-to-date -Prior hepatitis C screen negative -Tetanus up-to-date -Continue weight loss efforts   Return in about 3 months (around 09/29/2023).    Evelena Peat, MD

## 2023-07-01 ENCOUNTER — Ambulatory Visit: Payer: 59

## 2023-07-01 ENCOUNTER — Ambulatory Visit (HOSPITAL_COMMUNITY): Payer: 59

## 2023-07-05 ENCOUNTER — Ambulatory Visit: Payer: 59 | Admitting: Family Medicine

## 2023-07-05 ENCOUNTER — Encounter: Payer: Self-pay | Admitting: Family Medicine

## 2023-07-05 VITALS — BP 136/68 | HR 83 | Temp 98.1°F | Ht 73.0 in | Wt 285.0 lb

## 2023-07-05 DIAGNOSIS — M7742 Metatarsalgia, left foot: Secondary | ICD-10-CM

## 2023-07-05 NOTE — Progress Notes (Signed)
Established Patient Office Visit  Subjective   Patient ID: Paul [name], male    DOB: 08-23-1961  Age: 62 y.o. MRN: [id number]  Chief Complaint  Patient presents with   Foot Pain    Patient complains of left foot pain, x3 days, Tried Tylenol with little relief     HPI   Paul [name] is seen today with left foot pain volar surface ball of the foot.  Noted the past few days.  His wife was recently in hospital and did a lot of walking in a pair of shoes that do not have a tremendous amount of support.  He does have some chronic neuropathy following back surgery 10 to 12 years ago but this pain is different.  Denies any specific injury.  Has taken some Tylenol with mild relief.  No visible swelling.  He does have history of peripheral vascular disease but denies any claudication type symptoms.  Had recent physical.  Labs significant for A1c 6.3%.  Past Medical History:  Diagnosis Date   Allergy    seasonal allergies   Arthritis    on meds   Cancer (HCC)    skin   Cervicalgia 12/17/2009   GERD 11/20/2008   on meds   HYPERLIPIDEMIA 11/20/2008   on meds   Hypertension    on meds   OBESITY 01/24/2009   PERIPHERAL VASCULAR DISEASE 01/24/2009   PREDIABETES 06/12/2010   Prostate CA (HCC) 2021   dx 05/13/2020   RHINITIS 04/03/2009   SHOULDER PAIN 11/26/2009   Past Surgical History:  Procedure Laterality Date   BACK SURGERY  02-29-12   lumbar fusion per Dr. Sharolyn Douglas in [place]    KNEE SURGERY Right    SHOULDER SURGERY Left    WISDOM TOOTH EXTRACTION      reports that he has never smoked. He has never used smokeless tobacco. He reports that he does not currently use alcohol after a past usage of about 1.0 standard drink of alcohol per week. He reports that he does not use drugs. family history includes Cancer in his paternal grandfather; Heart disease in his maternal uncle; Hyperlipidemia in his mother; Hypertension in his father and mother. Allergies  Allergen Reactions   Amoxicillin-Pot  Clavulanate Diarrhea   Atorvastatin     REACTION: myalgia   Cephalexin Nausea Only and Nausea And Vomiting   Grass Pollen(K-O-R-T-Swt Vern)    Oxycodone Hcl Nausea Only  \  Review of Systems  Constitutional:  Negative for chills and fever.  Neurological:  Negative for weakness.      Objective:     BP 136/68 (BP Location: Left Arm, Patient Position: Sitting, Cuff Size: Large)   Pulse 83   Temp 98.1 F (36.7 C) (Oral)   Ht 6\' 1"  (1.854 m)   Wt 285 lb (129.3 kg)   SpO2 98%   BMI 37.60 kg/m  BP Readings from Last 3 Encounters:  07/05/23 136/68  06/30/23 (!) 140/70  11/23/22 127/82   Wt Readings from Last 3 Encounters:  07/05/23 285 lb (129.3 kg)  06/29/23 280 lb 14.4 oz (127.4 kg)  11/23/22 281 lb (127.5 kg)      Physical Exam Vitals reviewed.  Constitutional:      General: He is not in acute distress.    Appearance: Normal appearance.  Cardiovascular:     Rate and Rhythm: Normal rate and regular rhythm.  Musculoskeletal:     Comments: Left foot is pink in color and warm to touch with excellent  capillary refill throughout.  He has some tenderness metatarsal region especially the second toe and lesser extent third toe.  No callus.  No skin lesions.  No interdigital tenderness.  No bony tenderness in the foot or ankle region  Neurological:     Mental Status: He is alert.      No results found for any visits on 07/05/23.  Last CBC Lab Results  Component Value Date   WBC 5.5 06/29/2023   HGB 16.3 06/29/2023   HCT 50.3 06/29/2023   MCV 84.8 06/29/2023   MCH 28.3 06/04/2020   RDW 14.7 06/29/2023   PLT 192.0 06/29/2023   Last metabolic panel Lab Results  Component Value Date   GLUCOSE 125 (H) 06/29/2023   NA 141 06/29/2023   K 4.2 06/29/2023   CL 104 06/29/2023   CO2 31 06/29/2023   BUN 17 06/29/2023   CREATININE 1.03 06/29/2023   GFR 77.41 06/29/2023   CALCIUM 9.4 06/29/2023   PROT 7.1 06/29/2023   ALBUMIN 4.4 06/29/2023   BILITOT 0.7 06/29/2023    ALKPHOS 75 06/29/2023   AST 18 06/29/2023   ALT 23 06/29/2023   Last lipids Lab Results  Component Value Date   CHOL 112 06/29/2023   HDL 39.70 06/29/2023   LDLCALC 49 06/29/2023   TRIG 114.0 06/29/2023   CHOLHDL 3 06/29/2023   Last hemoglobin A1c Lab Results  Component Value Date   HGBA1C 6.3 06/29/2023      The ASCVD Risk score (Arnett DK, et al., 2019) failed to calculate for the following reasons:   The valid total cholesterol range is 130 to 320 mg/dL    Assessment & Plan:   Metatarsalgia involving left foot.  Symptoms predominantly second toe.  Recommend metatarsal pad.  Short-term only use of Advil as needed.  Can also try some icing.  Consider podiatry referral if not improving over the next couple of weeks  No follow-ups on file.    Evelena Peat, MD

## 2023-07-05 NOTE — Patient Instructions (Signed)
Suspect metatarsalgia  Get OTC metatarsal pad and wear well cushioned shoes  OK to use Advil short term (1-2 weeks).

## 2023-07-19 ENCOUNTER — Ambulatory Visit (AMBULATORY_SURGERY_CENTER): Payer: 59

## 2023-07-19 VITALS — Ht 73.0 in | Wt 278.0 lb

## 2023-07-19 DIAGNOSIS — Z860100 Personal history of colon polyps, unspecified: Secondary | ICD-10-CM

## 2023-07-19 MED ORDER — NA SULFATE-K SULFATE-MG SULF 17.5-3.13-1.6 GM/177ML PO SOLN: 1.0000 | Freq: Once | ORAL | 0 refills | Status: AC

## 2023-07-19 NOTE — Progress Notes (Signed)

## 2023-07-28 ENCOUNTER — Encounter: Payer: Self-pay | Admitting: Gastroenterology

## 2023-07-31 ENCOUNTER — Other Ambulatory Visit: Payer: Self-pay | Admitting: Family Medicine

## 2023-08-09 ENCOUNTER — Ambulatory Visit: Payer: 59 | Admitting: Gastroenterology

## 2023-08-09 ENCOUNTER — Encounter: Payer: Self-pay | Admitting: Gastroenterology

## 2023-08-09 VITALS — BP 135/77 | HR 73 | Temp 98.8°F | Resp 9 | Ht 73.0 in | Wt 278.0 lb

## 2023-08-09 DIAGNOSIS — K644 Residual hemorrhoidal skin tags: Secondary | ICD-10-CM | POA: Diagnosis not present

## 2023-08-09 DIAGNOSIS — K635 Polyp of colon: Secondary | ICD-10-CM

## 2023-08-09 DIAGNOSIS — Z1211 Encounter for screening for malignant neoplasm of colon: Secondary | ICD-10-CM

## 2023-08-09 DIAGNOSIS — Z860101 Personal history of adenomatous and serrated colon polyps: Secondary | ICD-10-CM | POA: Diagnosis not present

## 2023-08-09 DIAGNOSIS — D125 Benign neoplasm of sigmoid colon: Secondary | ICD-10-CM | POA: Diagnosis not present

## 2023-08-09 DIAGNOSIS — D123 Benign neoplasm of transverse colon: Secondary | ICD-10-CM

## 2023-08-09 DIAGNOSIS — K641 Second degree hemorrhoids: Secondary | ICD-10-CM | POA: Diagnosis not present

## 2023-08-09 DIAGNOSIS — Z860100 Personal history of colon polyps, unspecified: Secondary | ICD-10-CM

## 2023-08-09 MED ORDER — SODIUM CHLORIDE 0.9 % IV SOLN: 500.0000 mL | Freq: Once | INTRAVENOUS | Status: DC

## 2023-08-09 NOTE — Patient Instructions (Signed)
Await pathology results.  Continue present medications. Resume previous diet. High fiber diet.  Use FiberCon 1-2 tablets daily.  YOU HAD AN ENDOSCOPIC PROCEDURE TODAY AT THE [facility]:   Refer to the procedure report that was given to you for any specific questions about what was found during the examination.  If the procedure report does not answer your questions, please call your gastroenterologist to clarify.  If you requested that your care partner not be given the details of your procedure findings, then the procedure report has been included in a sealed envelope for you to review at your convenience later.  YOU SHOULD EXPECT: Some feelings of bloating in the abdomen. Passage of more gas than usual.  Walking can help get rid of the air that was put into your GI tract during the procedure and reduce the bloating. If you had a lower endoscopy (such as a colonoscopy or flexible sigmoidoscopy) you may notice spotting of blood in your stool or on the toilet paper. If you underwent a bowel prep for your procedure, you may not have a normal bowel movement for a few days.  Please Note:  You might notice some irritation and congestion in your nose or some drainage.  This is from the oxygen used during your procedure.  There is no need for concern and it should clear up in a day or so.  SYMPTOMS TO REPORT IMMEDIATELY:  Following lower endoscopy (colonoscopy or flexible sigmoidoscopy):  Excessive amounts of blood in the stool  Significant tenderness or worsening of abdominal pains  Swelling of the abdomen that is new, acute  Fever of 100F or higher   For urgent or emergent issues, a gastroenterologist can be reached at any hour by calling [phone]. Do not use MyChart messaging for urgent concerns.    DIET:  We do recommend a small meal at first, but then you may proceed to your regular diet.  Drink plenty of fluids but you should avoid alcoholic beverages for 24  hours.  ACTIVITY:  You should plan to take it easy for the rest of today and you should NOT DRIVE or use heavy machinery until tomorrow (because of the sedation medicines used during the test).    FOLLOW UP: Our staff will call the number listed on your records the next business day following your procedure.  We will call around 7:15- 8:00 am to check on you and address any questions or concerns that you may have regarding the information given to you following your procedure. If we do not reach you, we will leave a message.     If any biopsies were taken you will be contacted by phone or by letter within the next 1-3 weeks.  Please call us at [phone] if you have not heard about the biopsies in 3 weeks.    SIGNATURES/CONFIDENTIALITY: You and/or your care partner have signed paperwork which will be entered into your electronic medical record.  These signatures attest to the fact that that the information above on your After Visit Summary has been reviewed and is understood.  Full responsibility of the confidentiality of this discharge information lies with you and/or your care-partner.

## 2023-08-09 NOTE — Progress Notes (Signed)
Pt's states no medical or surgical changes since previsit or office visit. 

## 2023-08-09 NOTE — Progress Notes (Signed)
GASTROENTEROLOGY PROCEDURE H&P NOTE   Primary Care Physician: Kristian Covey, MD  HPI: Paul [name] is a 62 y.o. male who presents for Colonoscopy for surveillance of previous TAs and SSPs.  Past Medical History:  Diagnosis Date   Allergy    seasonal allergies   Arthritis    on meds   Cancer (HCC)    skin   Cervicalgia 12/17/2009   GERD 11/20/2008   on meds   HYPERLIPIDEMIA 11/20/2008   on meds   Hypertension    on meds   OBESITY 01/24/2009   PERIPHERAL VASCULAR DISEASE 01/24/2009   PREDIABETES 06/12/2010   Prostate CA (HCC) 2021   dx 05/13/2020   Prostate cancer (HCC) 2022   RHINITIS 04/03/2009   SHOULDER PAIN 11/26/2009   Past Surgical History:  Procedure Laterality Date   BACK SURGERY  02/29/2012   lumbar fusion per Dr. Sharolyn Douglas in [place]    COLONOSCOPY     KNEE SURGERY Right    SHOULDER SURGERY Left    WISDOM TOOTH EXTRACTION     Current Outpatient Medications  Medication Sig Dispense Refill   amLODipine (NORVASC) 10 MG tablet Take 1 tablet (10 mg total) by mouth daily. 90 tablet 3   aspirin 81 MG tablet Take 81 mg by mouth daily.       Cholecalciferol (VITAMIN D3) 50 MCG (2000 UT) TABS Take by mouth.     esomeprazole (NEXIUM) 20 MG capsule Take 20 mg by mouth daily at 12 noon.     fexofenadine (ALLEGRA) 180 MG tablet Take 180 mg by mouth daily.       fluticasone (FLONASE) 50 MCG/ACT nasal spray USE 2 SPRAYS INTO EACH NOSTRIL DAILY 16 g 3   hydrochlorothiazide (HYDRODIURIL) 12.5 MG tablet TAKE 1 TABLET BY MOUTH EVERY DAY 30 tablet 1   losartan (COZAAR) 50 MG tablet TAKE 2 TABLETS BY MOUTH EVERY DAY 60 tablet 2   rosuvastatin (CRESTOR) 10 MG tablet TAKE 1/2 TABLET BY MOUTH DAILY 15 tablet 1   sildenafil (REVATIO) 20 MG tablet TAKE 2 TO 5 TABLETS AS NEEDED 15 tablet 4   Current Facility-Administered Medications  Medication Dose Route Frequency Provider Last Rate Last Admin   0.9 %  sodium chloride infusion  500 mL Intravenous Once Mansouraty,  Netty Starring., MD        Current Outpatient Medications:    amLODipine (NORVASC) 10 MG tablet, Take 1 tablet (10 mg total) by mouth daily., Disp: 90 tablet, Rfl: 3   aspirin 81 MG tablet, Take 81 mg by mouth daily.  , Disp: , Rfl:    Cholecalciferol (VITAMIN D3) 50 MCG (2000 UT) TABS, Take by mouth., Disp: , Rfl:    esomeprazole (NEXIUM) 20 MG capsule, Take 20 mg by mouth daily at 12 noon., Disp: , Rfl:    fexofenadine (ALLEGRA) 180 MG tablet, Take 180 mg by mouth daily.  , Disp: , Rfl:    fluticasone (FLONASE) 50 MCG/ACT nasal spray, USE 2 SPRAYS INTO EACH NOSTRIL DAILY, Disp: 16 g, Rfl: 3   hydrochlorothiazide (HYDRODIURIL) 12.5 MG tablet, TAKE 1 TABLET BY MOUTH EVERY DAY, Disp: 30 tablet, Rfl: 1   losartan (COZAAR) 50 MG tablet, TAKE 2 TABLETS BY MOUTH EVERY DAY, Disp: 60 tablet, Rfl: 2   rosuvastatin (CRESTOR) 10 MG tablet, TAKE 1/2 TABLET BY MOUTH DAILY, Disp: 15 tablet, Rfl: 1   sildenafil (REVATIO) 20 MG tablet, TAKE 2 TO 5 TABLETS AS NEEDED, Disp: 15 tablet, Rfl: 4  Current  Facility-Administered Medications:    0.9 %  sodium chloride infusion, 500 mL, Intravenous, Once, Mansouraty, Netty Starring., MD Allergies  Allergen Reactions   Amoxicillin-Pot Clavulanate Diarrhea   Grass Pollen(K-O-R-T-Swt Vern)    Atorvastatin Other (See Comments)    REACTION: myalgia   Cephalexin Nausea And Vomiting and Nausea Only   Oxycodone Hcl Nausea Only   Family History  Problem Relation Age of Onset   Hypertension Mother    Hyperlipidemia Mother    Hypertension Father    Heart disease Maternal Uncle    Cancer Paternal Grandfather        prostate cancer   Colon polyps Neg Hx    Colon cancer Neg Hx    Esophageal cancer Neg Hx    Stomach cancer Neg Hx    Rectal cancer Neg Hx    Social History   Socioeconomic History   Marital status: Married    Spouse name: Not on file   Number of children: Not on file   Years of education: Not on file   Highest education level: Not on file  Occupational  History   Not on file  Tobacco Use   Smoking status: Never   Smokeless tobacco: Never  Vaping Use   Vaping status: Never Used  Substance and Sexual Activity   Alcohol use: Not Currently   Drug use: No   Sexual activity: Yes  Other Topics Concern   Not on file  Social History Narrative   Not on file   Social Drivers of Health   Financial Resource Strain: Not on file  Food Insecurity: Not on file  Transportation Needs: Not on file  Physical Activity: Not on file  Stress: Not on file  Social Connections: Not on file  Intimate Partner Violence: Not on file    Physical Exam: Today's Vitals   08/09/23 0739  BP: 134/78  Pulse: 88  Temp: 98.8 F (37.1 C)  TempSrc: Temporal  SpO2: 96%  Weight: 278 lb (126.1 kg)  Height: 6\' 1"  (1.854 m)   Body mass index is 36.68 kg/m. GEN: NAD EYE: Sclerae anicteric ENT: MMM CV: Non-tachycardic GI: Soft, NT/ND NEURO:  Alert & Oriented x 3  Lab Results: No results for input(s): "WBC", "HGB", "HCT", "PLT" in the last 72 hours. BMET No results for input(s): "NA", "K", "CL", "CO2", "GLUCOSE", "BUN", "CREATININE", "CALCIUM" in the last 72 hours. LFT No results for input(s): "PROT", "ALBUMIN", "AST", "ALT", "ALKPHOS", "BILITOT", "BILIDIR", "IBILI" in the last 72 hours. PT/INR No results for input(s): "LABPROT", "INR" in the last 72 hours.   Impression / Plan: This is a 62 y.o.male who presents for Colonoscopy for surveillance of previous TAs and SSPs.  The risks and benefits of endoscopic evaluation/treatment were discussed with the patient and/or family; these include but are not limited to the risk of perforation, infection, bleeding, missed lesions, lack of diagnosis, severe illness requiring hospitalization, as well as anesthesia and sedation related illnesses.  The patient's history has been reviewed, patient examined, no change in status, and deemed stable for procedure.  The patient and/or family is agreeable to proceed.     Corliss Parish, MD [facility] Office # [phone]

## 2023-08-09 NOTE — Progress Notes (Signed)
A/O x 3, gd SR's, VSS, report to RN

## 2023-08-09 NOTE — Op Note (Signed)
[facility] Patient Name: Paul [name] Procedure Date: 08/09/2023 8:27 AM MRN: [id number] Endoscopist: Corliss Parish , MD, [id number] Age: 62 Referring MD:  Date of Birth: 05-19-1961 Gender: Male Account #: [id number] Procedure:                Colonoscopy Indications:              Surveillance: Personal history of adenomatous                            polyps on last colonoscopy 3 years ago, High risk                            colon cancer surveillance: Personal history of                            sessile serrated colon polyp (less than 10 mm in                            size) with no dysplasia Medicines:                Monitored Anesthesia Care Procedure:                Pre-Anesthesia Assessment:                           - Prior to the procedure, a History and Physical                            was performed, and patient medications and                            allergies were reviewed. The patient's tolerance of                            previous anesthesia was also reviewed. The risks                            and benefits of the procedure and the sedation                            options and risks were discussed with the patient.                            All questions were answered, and informed consent                            was obtained. Prior Anticoagulants: The patient has                            taken no anticoagulant or antiplatelet agents. ASA                            Grade Assessment: III - A patient with severe  systemic disease. After reviewing the risks and                            benefits, the patient was deemed in satisfactory                            condition to undergo the procedure.                           After obtaining informed consent, the colonoscope                            was passed under direct vision. Throughout the                            procedure, the patient's blood pressure,  pulse, and                            oxygen saturations were monitored continuously. The                            Olympus Scope [id number] was introduced through the                            anus and advanced to the the cecum, identified by                            appendiceal orifice and ileocecal valve. The                            colonoscopy was performed without difficulty. The                            patient tolerated the procedure. The quality of the                            bowel preparation was good. The ileocecal valve,                            appendiceal orifice, and rectum were photographed. Scope In: 8:43:22 AM Scope Out: 9:01:34 AM Scope Withdrawal Time: 0 hours 13 minutes 34 seconds  Total Procedure Duration: 0 hours 18 minutes 12 seconds  Findings:                 The digital rectal exam findings include                            hemorrhoids. Pertinent negatives include no                            palpable rectal lesions.                           Three sessile polyps were found in the sigmoid  colon (2) and transverse colon (1). The polyps were                            2 to 4 mm in size. These polyps were removed with a                            cold snare. Resection and retrieval were complete.                           Normal mucosa was found in the entire colon                            otherwise.                           Non-bleeding non-thrombosed external and internal                            hemorrhoids were found during retroflexion, during                            perianal exam and during digital exam. The                            hemorrhoids were Grade II (internal hemorrhoids                            that prolapse but reduce spontaneously). Complications:            No immediate complications. Estimated Blood Loss:     Estimated blood loss was minimal. Impression:               - Hemorrhoids found on  digital rectal exam.                           - Three 2 to 4 mm polyps in the sigmoid colon and                            in the transverse colon, removed with a cold snare.                            Resected and retrieved.                           - Normal mucosa in the entire examined colon                            otherwise.                           - Non-bleeding non-thrombosed external and internal                            hemorrhoids. Recommendation:           - The patient will be observed  post-procedure,                            until all discharge criteria are met.                           - Discharge patient to home.                           - Patient has a contact number available for                            emergencies. The signs and symptoms of potential                            delayed complications were discussed with the                            patient. Return to normal activities tomorrow.                            Written discharge instructions were provided to the                            patient.                           - High fiber diet.                           - Use FiberCon 1-2 tablets PO daily.                           - Continue present medications.                           - Await pathology results.                           - Repeat colonoscopy in 3/5/7 and history of                            previous adenomatous years for surveillance based                            on pathology results.                           - The findings and recommendations were discussed                            with the patient.                           - The findings and recommendations were discussed  with the patient's family. Corliss Parish, MD 08/09/2023 9:05:24 AM

## 2023-08-09 NOTE — Progress Notes (Signed)
Called to room to assist during endoscopic procedure.  Patient ID and intended procedure confirmed with present staff. Received instructions for my participation in the procedure from the performing physician.  

## 2023-08-10 ENCOUNTER — Telehealth: Payer: Self-pay | Admitting: *Deleted

## 2023-08-10 NOTE — Telephone Encounter (Signed)
  Follow up Call-     08/09/2023    7:39 AM  Call back number  Post procedure Call Back phone  # [phone]  Permission to leave phone message Yes     Patient questions:  Do you have a fever, pain , or abdominal swelling? No. Pain Score  0 *  Have you tolerated food without any problems? Yes.    Have you been able to return to your normal activities? Yes.    Do you have any questions about your discharge instructions: Diet   No. Medications  No. Follow up visit  No.  Do you have questions or concerns about your Care? No.  Actions: * If pain score is 4 or above: No action needed, pain <4.

## 2023-08-11 LAB — SURGICAL PATHOLOGY

## 2023-08-13 ENCOUNTER — Encounter: Payer: Self-pay | Admitting: Gastroenterology

## 2023-08-20 ENCOUNTER — Other Ambulatory Visit: Payer: Self-pay | Admitting: Family Medicine

## 2023-08-20 DIAGNOSIS — I10 Essential (primary) hypertension: Secondary | ICD-10-CM

## 2023-08-25 NOTE — Telephone Encounter (Signed)
 VOB initiated for 2025.

## 2023-08-27 ENCOUNTER — Other Ambulatory Visit: Payer: Self-pay | Admitting: Family Medicine

## 2023-08-31 NOTE — Telephone Encounter (Signed)
 [employer] Stable - Prior Authorization NOT required

## 2023-08-31 NOTE — Telephone Encounter (Signed)
 GELSYN for RIGHT knee OA  OK to schedule on or after 08/24/23  Primary Insurance: [facility] [employer] Plus POS Co-pay: $50 Co-insurance: 0% Deductible: $0 of $1000 met Prior Auth: NOT required   Knee Injection History 04/14/20 - Kenalog  RIGHT 05/13/20 - Gelsyn #1 RIGHT 05/19/20 - Gelsyn #2 RIGHT 05/26/20 - Gelsyn #3 RIGHT 01/07/21 - Gelsyn #1 RIGHT 01/14/21 - Gelsyn #2 RIGHT 01/21/21 - Gelsyn #3 RIGHT 11/12/22 - Gelsyn #1 RIGHT 11/22/22 - Gelsyn #2 RIGHT 11/29/22 - Gelsyn #3 RIGHT

## 2023-09-01 ENCOUNTER — Ambulatory Visit: Payer: 59

## 2023-09-01 ENCOUNTER — Encounter (HOSPITAL_COMMUNITY): Payer: 59

## 2023-09-11 ENCOUNTER — Other Ambulatory Visit: Payer: Self-pay | Admitting: Family Medicine

## 2023-09-11 DIAGNOSIS — I10 Essential (primary) hypertension: Secondary | ICD-10-CM

## 2023-09-16 ENCOUNTER — Ambulatory Visit (HOSPITAL_COMMUNITY)
Admission: RE | Admit: 2023-09-16 | Discharge: 2023-09-16 | Disposition: A | Discharge status: Home / Self Care | Payer: 59 | Source: Ambulatory Visit | Attending: Physician Assistant | Admitting: Physician Assistant

## 2023-09-16 ENCOUNTER — Ambulatory Visit: Payer: 59 | Admitting: Physician Assistant

## 2023-09-16 VITALS — BP 154/84 | HR 89 | Temp 98.7°F | Resp 18 | Ht 73.0 in | Wt 283.1 lb

## 2023-09-16 DIAGNOSIS — I70213 Atherosclerosis of native arteries of extremities with intermittent claudication, bilateral legs: Secondary | ICD-10-CM | POA: Diagnosis not present

## 2023-09-16 DIAGNOSIS — I739 Peripheral vascular disease, unspecified: Secondary | ICD-10-CM | POA: Insufficient documentation

## 2023-09-16 LAB — VAS US ABI WITH/WO TBI
Left ABI: 0.78
Right ABI: 0.98

## 2023-09-16 NOTE — Progress Notes (Signed)
Office Note     CC:  follow up Requesting Provider:  Kristian Covey, MD  HPI: Paul [name] is a 63 y.o. (Apr 07, 1961) male who presents for routine follow up of PAD. We have been following him for lower extremity claudication, left greater than right. He has been able to tolerate walking 200-400 yards before becoming symptomatic. However at his last visit in July of 2022 this had almost completely resolved. 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 He unfortunately had just found out he had prostate cancer but otherwise was doing well.  Today he reports her overall is doing well. He is still being monitored for his prostate cancer. Just had his PSA levels checked last week and they were up a little so he says he has to follow up in near future for that. He otherwise is still dealing with dislocated left thumb that is in splint and continues to get injections in his right knee for arthritis. He denies any pain on ambulation in his legs. No pain at rest or tissue loss. History of multiple back surgeries . He does continue to have neuropathy in left foot that has been present since his last back surgery. Chronic numbness of his left foot.  He was on Gabapentin but felt it was not helping so he stopped taking it. He remains very active taking care of his wife with a lot of chronic medical issues as well as elderly father and his farm.   The pt is on a statin for cholesterol management.  The pt is on a daily aspirin.   Other AC:  no The pt is on HCTZ, ARB for hypertension.   The pt is not diabetic.   Tobacco hx:  non smoker  Past Medical History:  Diagnosis Date   Allergy    seasonal allergies   Arthritis    on meds   Cancer (HCC)    skin   Cervicalgia 12/17/2009   GERD 11/20/2008   on meds   HYPERLIPIDEMIA 11/20/2008   on meds   Hypertension    on meds   OBESITY 01/24/2009   PERIPHERAL VASCULAR DISEASE 01/24/2009   PREDIABETES 06/12/2010   Prostate  CA (HCC) 2021   dx 05/13/2020   Prostate cancer (HCC) 2022   RHINITIS 04/03/2009   SHOULDER PAIN 11/26/2009    Past Surgical History:  Procedure Laterality Date   BACK SURGERY  02/29/2012   lumbar fusion per Dr. Sharolyn Douglas in [place]    COLONOSCOPY  2021   TA, SSP and HPP, sutab adequate prep   KNEE SURGERY Right    SHOULDER SURGERY Left    WISDOM TOOTH EXTRACTION      Social History   Socioeconomic History   Marital status: Married    Spouse name: Not on file   Number of children: Not on file   Years of education: Not on file   Highest education level: Not on file  Occupational History   Not on file  Tobacco Use   Smoking status: Never   Smokeless tobacco: Never  Vaping Use   Vaping status: Never Used  Substance and Sexual Activity   Alcohol use: Not Currently   Drug use: No   Sexual activity: Yes  Other Topics Concern   Not on file  Social History Narrative   Not on file   Social Drivers of Health   Financial Resource Strain: Not on file  Food Insecurity: Not on  file  Transportation Needs: Not on file  Physical Activity: Not on file  Stress: Not on file  Social Connections: Not on file  Intimate Partner Violence: Not on file    Family History  Problem Relation Age of Onset   Hypertension Mother    Hyperlipidemia Mother    Hypertension Father    Heart disease Maternal Uncle    Cancer Paternal Grandfather        prostate cancer   Colon polyps Neg Hx    Colon cancer Neg Hx    Esophageal cancer Neg Hx    Stomach cancer Neg Hx    Rectal cancer Neg Hx     Current Outpatient Medications  Medication Sig Dispense Refill   amLODipine (NORVASC) 10 MG tablet Take 1 tablet (10 mg total) by mouth daily. 90 tablet 3   aspirin 81 MG tablet Take 81 mg by mouth daily.       Cholecalciferol (VITAMIN D3) 50 MCG (2000 UT) TABS Take by mouth.     esomeprazole (NEXIUM) 20 MG capsule Take 20 mg by mouth daily at 12 noon.     fexofenadine (ALLEGRA) 180 MG tablet  Take 180 mg by mouth daily.       fluticasone (FLONASE) 50 MCG/ACT nasal spray USE 2 SPRAYS INTO EACH NOSTRIL DAILY 16 g 3   hydrochlorothiazide (HYDRODIURIL) 12.5 MG tablet TAKE 1 TABLET BY MOUTH EVERY DAY 90 tablet 1   losartan (COZAAR) 50 MG tablet TAKE 2 TABLETS BY MOUTH EVERY DAY 60 tablet 2   rosuvastatin (CRESTOR) 10 MG tablet TAKE 1/2 TABLET BY MOUTH DAILY 15 tablet 1   sildenafil (REVATIO) 20 MG tablet TAKE 2 TO 5 TABLETS AS NEEDED 15 tablet 1   No current facility-administered medications for this visit.    Allergies  Allergen Reactions   Amoxicillin-Pot Clavulanate Diarrhea   Grass Pollen(K-O-R-T-Swt Vern)    Atorvastatin Other (See Comments)    REACTION: myalgia   Cephalexin Nausea And Vomiting and Nausea Only   Oxycodone Hcl Nausea Only     REVIEW OF SYSTEMS:   [X]  denotes positive finding, [ ]  denotes negative finding Cardiac  Comments:  Chest pain or chest pressure:    Shortness of breath upon exertion:    Short of breath when lying flat:    Irregular heart rhythm:        Vascular    Pain in calf, thigh, or hip brought on by ambulation:    Pain in feet at night that wakes you up from your sleep:     Blood clot in your veins:    Leg swelling:         Pulmonary    Oxygen at home:    Productive cough:     Wheezing:         Neurologic    Sudden weakness in arms or legs:     Sudden numbness in arms or legs:     Sudden onset of difficulty speaking or slurred speech:    Temporary loss of vision in one eye:     Problems with dizziness:         Gastrointestinal    Blood in stool:     Vomited blood:         Genitourinary    Burning when urinating:     Blood in urine:        Psychiatric    Major depression:         Hematologic    Bleeding problems:  Problems with blood clotting too easily:        Skin    Rashes or ulcers:        Constitutional    Fever or chills:      PHYSICAL EXAMINATION:  Vitals:   09/16/23 0909  BP: (!) 154/84   Pulse: 89  Resp: 18  Temp: 98.7 F (37.1 C)  TempSrc: Temporal  SpO2: 95%  Weight: 283 lb 1.6 oz (128.4 kg)  Height: 6\' 1"  (1.854 m)    General:  WDWN in NAD; vital signs documented above Gait: Normal HENT: WNL, normocephalic Pulmonary: normal non-labored breathing , without wheezing Cardiac: regular HR Abdomen: soft Vascular Exam/Pulses: 2+ femoral, 2+ right Dp and 2+ left PT. Feet warm and well perfused Extremities: without ischemic changes, without Gangrene , without cellulitis; without open wounds; mild edema right > left ankle Musculoskeletal: no muscle wasting or atrophy  Neurologic: A&O X 3 Psychiatric:  The pt has Normal affect.   Non-Invasive Vascular Imaging:   +-------+-----------+-----------+------------+------------+  ABI/TBIToday's ABIToday's TBIPrevious ABIPrevious TBI  +-------+-----------+-----------+------------+------------+  Right 0.98       0.91       1.27        0.99          +-------+-----------+-----------+------------+------------+  Left  0.78       0.59       0.85        0.59          +-------+-----------+-----------+------------+------------+    ASSESSMENT/PLAN:: 63 y.o. male here for follow up for PAD. He overall is doing well. No claudication symptoms, rest pain or tissue loss. Discussed importance of frequent foot checks and keeping his feet protected.  - Bilateral ABIs decreased slightly but TBIs stable - encourage exercise/ walking regimen - Continue Aspirin and statin  - He will follow up again in 2 years with repeat ABI. He knows to call should he have any new or worsening symptoms  Graceann Congress, PA-C [facility] [phone]  Clinic MD:   Hetty Blend

## 2023-09-25 ENCOUNTER — Other Ambulatory Visit: Payer: Self-pay | Admitting: Family Medicine

## 2023-09-30 ENCOUNTER — Encounter: Payer: Self-pay | Admitting: Family Medicine

## 2023-09-30 ENCOUNTER — Ambulatory Visit: Payer: 59 | Admitting: Family Medicine

## 2023-09-30 VITALS — BP 150/60 | HR 94 | Temp 98.4°F | Wt 276.2 lb

## 2023-09-30 DIAGNOSIS — I10 Essential (primary) hypertension: Secondary | ICD-10-CM

## 2023-09-30 DIAGNOSIS — R7303 Prediabetes: Secondary | ICD-10-CM | POA: Diagnosis not present

## 2023-09-30 MED ORDER — TELMISARTAN 40 MG PO TABS: 40.0000 mg | ORAL_TABLET | Freq: Every day | ORAL | 3 refills | Status: DC

## 2023-09-30 NOTE — Progress Notes (Signed)
 Established Patient Office Visit  Subjective   Patient ID: Paul [name], male    DOB: [birth year]  Age: 63 y.o. MRN: [id number]  Chief Complaint  Patient presents with   Medical Management of Chronic Issues    HPI   Meryl is seen for follow-up regarding poorly controlled blood pressure.  Last visit we increased his amlodipine  to 10 mg and he also remains on HCTZ 12.5 mg daily and losartan  100 mg daily.  Tries to watch sodium intake.  Does not eat out very frequently.  He and his wife cooks frequently at home.  No alcohol use.  No recent headaches or dizziness.  Compliant with medications.  Denies any peripheral edema since increasing amlodipine .  Recent labs reviewed and stable.  He does have prediabetes range blood sugars with last A1c 6.3%.  We plan to recheck this in May.  Discussed diet regarding hypoglycemia today  Past Medical History:  Diagnosis Date   Allergy    seasonal allergies   Arthritis    on meds   Cancer (HCC)    skin   Cervicalgia 12/17/2009   GERD 11/20/2008   on meds   HYPERLIPIDEMIA 11/20/2008   on meds   Hypertension    on meds   OBESITY 01/24/2009   PERIPHERAL VASCULAR DISEASE 01/24/2009   PREDIABETES 06/12/2010   Prostate CA (HCC) 2021   dx 05/13/2020   Prostate cancer (HCC) 2022   RHINITIS 04/03/2009   SHOULDER PAIN 11/26/2009   Past Surgical History:  Procedure Laterality Date   BACK SURGERY  02/29/2012   lumbar fusion per Dr. Royden Schneider in [place]    COLONOSCOPY  2021   TA, SSP and HPP, sutab  adequate prep   KNEE SURGERY Right    SHOULDER SURGERY Left    WISDOM TOOTH EXTRACTION      reports that he has never smoked. He has never used smokeless tobacco. He reports that he does not currently use alcohol. He reports that he does not use drugs. family history includes Cancer in his paternal grandfather; Heart disease in his maternal uncle; Hyperlipidemia in his mother; Hypertension in his father and mother. Allergies  Allergen Reactions    Amoxicillin -Pot Clavulanate Diarrhea   Grass Pollen(K-O-R-T-Swt Vern)    Atorvastatin Other (See Comments)    REACTION: myalgia   Cephalexin Nausea And Vomiting and Nausea Only   Oxycodone Hcl Nausea Only    Review of Systems  Constitutional:  Negative for malaise/fatigue.  Eyes:  Negative for blurred vision.  Respiratory:  Negative for shortness of breath.   Cardiovascular:  Negative for chest pain.  Neurological:  Negative for dizziness, weakness and headaches.      Objective:     BP (!) 150/60 (BP Location: Left Arm, Patient Position: Sitting, Cuff Size: Large)   Pulse 94   Temp 98.4 F (36.9 C) (Oral)   Wt 276 lb 3.2 oz (125.3 kg)   SpO2 98%   BMI 36.44 kg/m  BP Readings from Last 3 Encounters:  09/30/23 (!) 150/60  09/16/23 (!) 154/84  08/09/23 135/77   Wt Readings from Last 3 Encounters:  09/30/23 276 lb 3.2 oz (125.3 kg)  09/16/23 283 lb 1.6 oz (128.4 kg)  08/09/23 278 lb (126.1 kg)      Physical Exam Vitals reviewed.  Constitutional:      General: He is not in acute distress.    Appearance: He is well-developed. He is not ill-appearing.  HENT:     Right Ear: External  ear normal.     Left Ear: External ear normal.  Eyes:     Pupils: Pupils are equal, round, and reactive to light.  Neck:     Thyroid : No thyromegaly.  Cardiovascular:     Rate and Rhythm: Normal rate and regular rhythm.  Pulmonary:     Effort: Pulmonary effort is normal. No respiratory distress.     Breath sounds: Normal breath sounds. No wheezing or rales.  Musculoskeletal:     Cervical back: Neck supple.  Neurological:     Mental Status: He is alert and oriented to person, place, and time.      No results found for any visits on 09/30/23.  Last metabolic panel Lab Results  Component Value Date   GLUCOSE 125 (H) 06/29/2023   NA 141 06/29/2023   K 4.2 06/29/2023   CL 104 06/29/2023   CO2 31 06/29/2023   BUN 17 06/29/2023   CREATININE 1.03 06/29/2023   GFR 77.41  06/29/2023   CALCIUM  9.4 06/29/2023   PROT 7.1 06/29/2023   ALBUMIN 4.4 06/29/2023   BILITOT 0.7 06/29/2023   ALKPHOS 75 06/29/2023   AST 18 06/29/2023   ALT 23 06/29/2023   Last lipids Lab Results  Component Value Date   CHOL 112 06/29/2023   HDL 39.70 06/29/2023   LDLCALC 49 06/29/2023   TRIG 114.0 06/29/2023   CHOLHDL 3 06/29/2023   Last hemoglobin A1c Lab Results  Component Value Date   HGBA1C 6.3 06/29/2023      The ASCVD Risk score (Arnett DK, et al., 2019) failed to calculate for the following reasons:   The valid total cholesterol range is 130 to 320 mg/dL    Assessment & Plan:   #1 hypertension suboptimally controlled.  Repeat today left arm seated after rest 154/78.  Recent increase amlodipine  to 10 mg daily.  Continue current dose of amlodipine  and HCTZ.  Discontinue losartan  and start telmisartan  40 mg daily hopefully for better blood pressure lowering.  Work on weight loss and continue low-sodium diet.  Set up follow-up in a couple of months.  If not further to goal at that point consider possible use of Aldactone  #2 prediabetes.  Discussed low glycemic diet.  Try to lose a few pounds.  Recheck A1c at follow-up.  Wolm Scarlet, MD

## 2023-10-23 ENCOUNTER — Other Ambulatory Visit: Payer: Self-pay | Admitting: Family Medicine

## 2023-12-17 ENCOUNTER — Other Ambulatory Visit: Payer: Self-pay | Admitting: Family Medicine

## 2023-12-28 ENCOUNTER — Ambulatory Visit: Payer: 59 | Admitting: Family Medicine

## 2023-12-28 VITALS — BP 142/70 | HR 76 | Temp 97.7°F | Wt 276.0 lb

## 2023-12-28 DIAGNOSIS — E785 Hyperlipidemia, unspecified: Secondary | ICD-10-CM | POA: Diagnosis not present

## 2023-12-28 DIAGNOSIS — I10 Essential (primary) hypertension: Secondary | ICD-10-CM | POA: Diagnosis not present

## 2023-12-28 DIAGNOSIS — R7303 Prediabetes: Secondary | ICD-10-CM | POA: Diagnosis not present

## 2023-12-28 LAB — POCT GLYCOSYLATED HEMOGLOBIN (HGB A1C): Hemoglobin A1C: 6.1 % — AB (ref 4.0–5.6)

## 2023-12-28 MED ORDER — ROSUVASTATIN CALCIUM 10 MG PO TABS: 5.0000 mg | ORAL_TABLET | Freq: Every day | ORAL | 3 refills | Status: AC

## 2023-12-28 MED ORDER — TELMISARTAN 80 MG PO TABS: 80.0000 mg | ORAL_TABLET | Freq: Every day | ORAL | 3 refills | Status: AC

## 2023-12-28 NOTE — Patient Instructions (Signed)
 A1C improved to 6.1%  Let's increase the Micardis  to 80 mg daily  Set up 3 month follow up.

## 2023-12-28 NOTE — Progress Notes (Signed)
 Established Patient Office Visit  Subjective   Patient ID: Paul [name], male    DOB: [birth year]  Age: 63 y.o. MRN: [id number]  Chief Complaint  Patient presents with   Medical Management of Chronic Issues    HPI   Paul [name] is seen for medical follow-up.  He has history of hyperglycemia, hyperlipidemia, hypertension.  He brings in his own blood pressure cuff today along with several readings.  Most of these have been 130s with occasional 140s systolic.  He has made some dietary changes and has lost a few pounds.  He has history of hyperglycemia with last A1c 6.3%.  Has tried to reduce overall starches.  His current blood pressure medications include telmisartan , HCTZ, and amlodipine .  Compliant with therapy.  He takes rosuvastatin  for hyperlipidemia.  Does need refills.  Labs were done in November and stable.  He does also have prostate cancer and states his last PSA was 7.4.  Followed closely by urology.  They plan to do follow-up PSA in July.  Past Medical History:  Diagnosis Date   Allergy    seasonal allergies   Arthritis    on meds   Cancer (HCC)    skin   Cervicalgia 12/17/2009   GERD 11/20/2008   on meds   HYPERLIPIDEMIA 11/20/2008   on meds   Hypertension    on meds   OBESITY 01/24/2009   PERIPHERAL VASCULAR DISEASE 01/24/2009   PREDIABETES 06/12/2010   Prostate CA (HCC) 2021   dx 05/13/2020   Prostate cancer (HCC) 2022   RHINITIS 04/03/2009   SHOULDER PAIN 11/26/2009   Past Surgical History:  Procedure Laterality Date   BACK SURGERY  02/29/2012   lumbar fusion per Dr. Jaquita Merl in [place]    COLONOSCOPY  2021   TA, SSP and HPP, sutab  adequate prep   KNEE SURGERY Right    SHOULDER SURGERY Left    WISDOM TOOTH EXTRACTION      reports that he has never smoked. He has never used smokeless tobacco. He reports that he does not currently use alcohol. He reports that he does not use drugs. family history includes Cancer in his paternal grandfather; Heart  disease in his maternal uncle; Hyperlipidemia in his mother; Hypertension in his father and mother. Allergies  Allergen Reactions   Amoxicillin -Pot Clavulanate Diarrhea   Grass Pollen(K-O-R-T-Swt Vern)    Atorvastatin Other (See Comments)    REACTION: myalgia   Cephalexin Nausea And Vomiting and Nausea Only   Oxycodone Hcl Nausea Only    Review of Systems  Constitutional:  Negative for malaise/fatigue.  Eyes:  Negative for blurred vision.  Respiratory:  Negative for shortness of breath.   Cardiovascular:  Negative for chest pain.  Neurological:  Negative for dizziness, weakness and headaches.      Objective:     BP (!) 142/70 (BP Location: Left Arm, Cuff Size: Normal)   Pulse 76   Temp 97.7 F (36.5 C) (Oral)   Wt 276 lb (125.2 kg)   SpO2 97%   BMI 36.41 kg/m  BP Readings from Last 3 Encounters:  12/28/23 (!) 142/70  09/30/23 (!) 150/60  09/16/23 (!) 154/84   Wt Readings from Last 3 Encounters:  12/28/23 276 lb (125.2 kg)  09/30/23 276 lb 3.2 oz (125.3 kg)  09/16/23 283 lb 1.6 oz (128.4 kg)      Physical Exam Vitals reviewed.  Constitutional:      General: He is not in acute distress.    Appearance:  He is well-developed. He is not ill-appearing.  HENT:     Right Ear: External ear normal.     Left Ear: External ear normal.  Eyes:     Pupils: Pupils are equal, round, and reactive to light.  Neck:     Thyroid : No thyromegaly.  Cardiovascular:     Rate and Rhythm: Normal rate and regular rhythm.  Pulmonary:     Effort: Pulmonary effort is normal. No respiratory distress.     Breath sounds: Normal breath sounds. No wheezing or rales.  Musculoskeletal:     Cervical back: Neck supple.  Neurological:     Mental Status: He is alert and oriented to person, place, and time.      Results for orders placed or performed in visit on 12/28/23  POC HgB A1c  Result Value Ref Range   Hemoglobin A1C 6.1 (A) 4.0 - 5.6 %   HbA1c POC (<> result, manual entry)      HbA1c, POC (prediabetic range)     HbA1c, POC (controlled diabetic range)        The ASCVD Risk score (Arnett DK, et al., 2019) failed to calculate for the following reasons:   The valid total cholesterol range is 130 to 320 mg/dL    Assessment & Plan:   Problem List Items Addressed This Visit       Unprioritized   Prediabetes - Primary   Relevant Orders   POC HgB A1c (Completed)   Hyperlipidemia   Relevant Medications   rosuvastatin  (CRESTOR ) 10 MG tablet   telmisartan  (MICARDIS ) 80 MG tablet   Hypertension   Relevant Medications   rosuvastatin  (CRESTOR ) 10 MG tablet   telmisartan  (MICARDIS ) 80 MG tablet  -Hypertension suboptimally controlled.  Continue weight loss efforts.  Increase telmisartan  to 80 mg daily.  Set up 43-month follow-up.  We checked his home blood pressure monitor with ours today and his readings were significantly off.  We have recommended considering new home blood pressure monitor  -Continue lower glycemic diet.  A1c today improved to 6.1%.  -Refill rosuvastatin  for 1 year.  Recheck lipids and CMP by next fall  Return in about 3 months (around 03/29/2024).    Glean Lamy, MD

## 2024-01-23 ENCOUNTER — Ambulatory Visit: Admitting: Family Medicine

## 2024-01-23 ENCOUNTER — Ambulatory Visit: Payer: Self-pay

## 2024-01-23 ENCOUNTER — Encounter: Payer: Self-pay | Admitting: Family Medicine

## 2024-01-23 ENCOUNTER — Ambulatory Visit (INDEPENDENT_AMBULATORY_CARE_PROVIDER_SITE_OTHER)

## 2024-01-23 VITALS — BP 142/62 | HR 80 | Temp 97.8°F | Wt 278.2 lb

## 2024-01-23 DIAGNOSIS — M79672 Pain in left foot: Secondary | ICD-10-CM

## 2024-01-23 NOTE — Progress Notes (Signed)
 Established Patient Office Visit  Subjective   Patient ID: Paul [name], male    DOB: 1961/05/15  Age: 63 y.o. MRN: [id number]  Chief Complaint  Patient presents with   Foot Pain    Patient complains of left foot pain after injury, x4 days, Tried Advil and Tylenol    HPI   Paul [name] seen with left foot pain following injury last Thursday.  He was walking in his dog lot and one of the dogs had dug a hole in the ground.  His foot rolled over as he stepped into the hole.  He described somewhat of an inversion type injury.  He has some chronic neuropathy so his pain is impaired but he had some pain with ambulation mostly distal fourth metatarsal region.  Interestingly, denies any ankle pain.  No Achilles pain or swelling. No sense of instability.  Past Medical History:  Diagnosis Date   Allergy    seasonal allergies   Arthritis    on meds   Cancer (HCC)    skin   Cervicalgia 12/17/2009   GERD 11/20/2008   on meds   HYPERLIPIDEMIA 11/20/2008   on meds   Hypertension    on meds   OBESITY 01/24/2009   PERIPHERAL VASCULAR DISEASE 01/24/2009   PREDIABETES 06/12/2010   Prostate CA (HCC) 2021   dx 05/13/2020   Prostate cancer (HCC) 2022   RHINITIS 04/03/2009   SHOULDER PAIN 11/26/2009   Past Surgical History:  Procedure Laterality Date   BACK SURGERY  02/29/2012   lumbar fusion per Dr. Jaquita Merl in [place]    COLONOSCOPY  2021   TA, SSP and HPP, sutab  adequate prep   KNEE SURGERY Right    SHOULDER SURGERY Left    WISDOM TOOTH EXTRACTION      reports that he has never smoked. He has never used smokeless tobacco. He reports that he does not currently use alcohol. He reports that he does not use drugs. family history includes Cancer in his paternal grandfather; Heart disease in his maternal uncle; Hyperlipidemia in his mother; Hypertension in his father and mother. Allergies  Allergen Reactions   Amoxicillin -Pot Clavulanate Diarrhea   Grass Pollen(K-O-R-T-Swt Vern)     Atorvastatin Other (See Comments)    REACTION: myalgia   Cephalexin Nausea And Vomiting and Nausea Only   Oxycodone Hcl Nausea Only    Review of Systems  Neurological:  Negative for focal weakness.      Objective:     BP (!) 142/62 (BP Location: Left Arm, Patient Position: Sitting, Cuff Size: Large)   Pulse 80   Temp 97.8 F (36.6 C) (Oral)   Wt 278 lb 3.2 oz (126.2 kg)   SpO2 96%   BMI 36.70 kg/m  BP Readings from Last 3 Encounters:  01/23/24 (!) 142/62  12/28/23 (!) 142/70  09/30/23 (!) 150/60   Wt Readings from Last 3 Encounters:  01/23/24 278 lb 3.2 oz (126.2 kg)  12/28/23 276 lb (125.2 kg)  09/30/23 276 lb 3.2 oz (125.3 kg)      Physical Exam Vitals reviewed.  Constitutional:      General: He is not in acute distress.    Appearance: He is not ill-appearing.  Cardiovascular:     Rate and Rhythm: Normal rate and regular rhythm.  Musculoskeletal:     Comments: Foot reveals some ecchymosis distally at the base of the 2nd through 5th toes.  He has some mild tenderness in palpating over the distal fourth metatarsal region  and to a lesser extent third.  No proximal fifth metatarsal tenderness.  Distal lateral and medial malleolus are nontender to palpation. Achilles nontender.  No pain with inversion or eversion  Neurological:     Mental Status: He is alert.      No results found for any visits on 01/23/24.    The ASCVD Risk score (Arnett DK, et al., 2019) failed to calculate for the following reasons:   The valid total cholesterol range is 130 to 320 mg/dL    Assessment & Plan:   Problem List Items Addressed This Visit   None Visit Diagnoses       Left foot pain    -  Primary   Relevant Orders   DG Foot Complete Left     He has underlying chronic peripheral neuropathy.  Does have some fourth distal metatarsal tenderness which is relatively mild to palpation.  Obtain plain x-rays Continue elevation at night and icing as needed for swelling.  No  follow-ups on file.    Glean Lamy, MD

## 2024-01-23 NOTE — Telephone Encounter (Signed)
Patient was scheduled for an appt today.

## 2024-01-23 NOTE — Telephone Encounter (Signed)
  Chief Complaint: left foot pain Symptoms: pain under ball of left foot, recent twisting of foot Frequency: x 6 months Pertinent Negatives: Patient denies  Disposition: [] ED /[] Urgent Care (no appt availability in office) / [x] Appointment(In office/virtual)/ []  [facility]/ [] Home Care/ [] Refused Recommended Disposition /[] [facility] [facility]/ []  Follow-up with PCP Additional Notes: previously seen for left foot pain, pain worsening. Appointment scheduled Copied from CRM [id number]. Topic: Clinical - Red Word Triage >> Jan 23, 2024 10:08 AM Chasity T wrote: [facility] that prompted transfer to Nurse Triage: patient is calling in for left foot pain. Explains that he was told by the doctor to try and wear better shoes to see if it will go away. States that pain is still in the bottom on the foot and is turning blue on top of the toes now. Reason for Disposition  [1] MODERATE pain (e.g., interferes with normal activities, limping) AND [2] present > 3 days  Answer Assessment - Initial Assessment Questions 1. ONSET: "When did the pain start?"      About six months ago 2. LOCATION: "Where is the pain located?"      Left foot, under toes/ball of foot 3. PAIN: "How bad is the pain?"    (Scale 1-10; or mild, moderate, severe)  - MILD (1-3): doesn't interfere with normal activities.   - MODERATE (4-7): interferes with normal activities (e.g., work or school) or awakens from sleep, limping.   - SEVERE (8-10): excruciating pain, unable to do any normal activities, unable to walk.      Walking with limp, 7/10 4. WORK OR EXERCISE: "Has there been any recent work or exercise that involved this part of the body?"      Twisted foot 4 days ago, now has toe bruising 5. CAUSE: "What do you think is causing the foot pain?"     unknown 6. OTHER SYMPTOMS: "Do you have any other symptoms?" (e.g., leg pain, rash, fever, numbness)     History of neuropathy  Protocols used: Foot Pain-A-AH

## 2024-01-23 NOTE — Patient Instructions (Signed)
 Will be in touch when X-ray over read is back.

## 2024-01-24 ENCOUNTER — Ambulatory Visit: Payer: Self-pay | Admitting: Family Medicine

## 2024-02-11 ENCOUNTER — Other Ambulatory Visit: Payer: Self-pay | Admitting: Family Medicine

## 2024-02-11 DIAGNOSIS — I10 Essential (primary) hypertension: Secondary | ICD-10-CM

## 2024-03-01 NOTE — Telephone Encounter (Signed)
 No future appointment in patient's chart. Will wait until patient calls requesting medication again.

## 2024-03-05 ENCOUNTER — Other Ambulatory Visit: Payer: Self-pay | Admitting: Urology

## 2024-03-05 ENCOUNTER — Encounter: Payer: Self-pay | Admitting: Urology

## 2024-03-05 DIAGNOSIS — C61 Malignant neoplasm of prostate: Secondary | ICD-10-CM

## 2024-03-31 ENCOUNTER — Ambulatory Visit
Admission: RE | Admit: 2024-03-31 | Discharge: 2024-03-31 | Disposition: A | Discharge status: Home / Self Care | Source: Ambulatory Visit | Attending: Urology | Admitting: Urology

## 2024-03-31 DIAGNOSIS — C61 Malignant neoplasm of prostate: Secondary | ICD-10-CM

## 2024-03-31 MED ORDER — GADOPICLENOL 0.5 MMOL/ML IV SOLN
10.0000 mL | Freq: Once | INTRAVENOUS | Status: AC | PRN
  Administered 2024-03-31: 10 mL via INTRAVENOUS

## 2024-04-03 ENCOUNTER — Ambulatory Visit: Admitting: Family Medicine

## 2024-04-03 ENCOUNTER — Encounter: Payer: Self-pay | Admitting: Family Medicine

## 2024-04-03 VITALS — BP 130/60 | HR 75 | Temp 98.3°F | Wt 280.6 lb

## 2024-04-03 DIAGNOSIS — E785 Hyperlipidemia, unspecified: Secondary | ICD-10-CM | POA: Diagnosis not present

## 2024-04-03 DIAGNOSIS — I10 Essential (primary) hypertension: Secondary | ICD-10-CM | POA: Diagnosis not present

## 2024-04-03 DIAGNOSIS — R7303 Prediabetes: Secondary | ICD-10-CM | POA: Diagnosis not present

## 2024-04-03 NOTE — Progress Notes (Signed)
 Established Patient Office Visit  Subjective   Patient ID: Paul [name], male    DOB: Aug 05, 1961  Age: 63 y.o. MRN: [id number]  Chief Complaint  Patient presents with   Medical Management of Chronic Issues    HPI   Paul [name] is seen for follow-up hypertension.  We recently increased his telmisartan  to 80 mg from 40 mg.  Paul [name] remains on HCTZ and amlodipine .  Compliant with medications.  Paul [name] also obtained recent new blood pressure cuff and most of his home readings have been 120-100 30s systolic.  No headaches.  Did have recent bump in his PSA and had recent follow-up MRI scan and is waiting for potential repeat biopsy.  History of elevated blood sugars.  Last A1c stable at 6.1%.  Paul [name] remains on rosuvastatin  10 mg daily for hyperlipidemia.  No recent myalgias.  Past Medical History:  Diagnosis Date   Allergy    seasonal allergies   Arthritis    on meds   Cancer (HCC)    skin   Cervicalgia 12/17/2009   GERD 11/20/2008   on meds   HYPERLIPIDEMIA 11/20/2008   on meds   Hypertension    on meds   OBESITY 01/24/2009   PERIPHERAL VASCULAR DISEASE 01/24/2009   PREDIABETES 06/12/2010   Prostate CA (HCC) 2021   dx 05/13/2020   Prostate cancer (HCC) 2022   RHINITIS 04/03/2009   SHOULDER PAIN 11/26/2009   Past Surgical History:  Procedure Laterality Date   BACK SURGERY  02/29/2012   lumbar fusion per Dr. Royden Schneider in [place]    COLONOSCOPY  2021   TA, SSP and HPP, sutab  adequate prep   KNEE SURGERY Right    SHOULDER SURGERY Left    WISDOM TOOTH EXTRACTION      reports that Paul [name] has never smoked. Paul [name] has never used smokeless tobacco. Paul [name] reports that Paul [name] does not currently use alcohol. Paul [name] reports that Paul [name] does not use drugs. family history includes Cancer in his paternal grandfather; Heart disease in his maternal uncle; Hyperlipidemia in his mother; Hypertension in his father and mother. Allergies  Allergen Reactions   Amoxicillin -Pot Clavulanate Diarrhea   Grass  Pollen(K-O-R-T-Swt Vern)    Atorvastatin Other (See Comments)    REACTION: myalgia   Cephalexin Nausea And Vomiting and Nausea Only   Oxycodone Hcl Nausea Only    Review of Systems  Constitutional:  Negative for malaise/fatigue.  Eyes:  Negative for blurred vision.  Respiratory:  Negative for shortness of breath.   Cardiovascular:  Negative for chest pain.  Neurological:  Negative for dizziness, weakness and headaches.      Objective:     BP 130/60   Pulse 75   Temp 98.3 F (36.8 C) (Oral)   Wt 280 lb 9.6 oz (127.3 kg)   SpO2 96%   BMI 37.02 kg/m  BP Readings from Last 3 Encounters:  04/03/24 130/60  01/23/24 (!) 142/62  12/28/23 (!) 142/70   Wt Readings from Last 3 Encounters:  04/03/24 280 lb 9.6 oz (127.3 kg)  01/23/24 278 lb 3.2 oz (126.2 kg)  12/28/23 276 lb (125.2 kg)      Physical Exam Vitals reviewed.  Constitutional:      Appearance: Paul [name] is well-developed.  HENT:     Right Ear: External ear normal.     Left Ear: External ear normal.  Eyes:     Pupils: Pupils are equal, round, and reactive to light.  Neck:     Thyroid : No thyromegaly.  Cardiovascular:     Rate and Rhythm: Normal rate and regular rhythm.  Pulmonary:     Effort: Pulmonary effort is normal. No respiratory distress.     Breath sounds: Normal breath sounds. No wheezing or rales.  Musculoskeletal:     Cervical back: Neck supple.  Neurological:     Mental Status: Paul [name] is alert and oriented to person, place, and time.      No results found for any visits on 04/03/24.  Last CBC Lab Results  Component Value Date   WBC 5.5 06/29/2023   HGB 16.3 06/29/2023   HCT 50.3 06/29/2023   MCV 84.8 06/29/2023   MCH 28.3 06/04/2020   RDW 14.7 06/29/2023   PLT 192.0 06/29/2023   Last metabolic panel Lab Results  Component Value Date   GLUCOSE 125 (H) 06/29/2023   NA 141 06/29/2023   K 4.2 06/29/2023   CL 104 06/29/2023   CO2 31 06/29/2023   BUN 17 06/29/2023   CREATININE 1.03  06/29/2023   GFR 77.41 06/29/2023   CALCIUM  9.4 06/29/2023   PROT 7.1 06/29/2023   ALBUMIN 4.4 06/29/2023   BILITOT 0.7 06/29/2023   ALKPHOS 75 06/29/2023   AST 18 06/29/2023   ALT 23 06/29/2023   Last lipids Lab Results  Component Value Date   CHOL 112 06/29/2023   HDL 39.70 06/29/2023   LDLCALC 49 06/29/2023   TRIG 114.0 06/29/2023   CHOLHDL 3 06/29/2023   Last hemoglobin A1c Lab Results  Component Value Date   HGBA1C 6.1 (A) 12/28/2023      The ASCVD Risk score (Arnett DK, et al., 2019) failed to calculate for the following reasons:   The valid total cholesterol range is 130 to 320 mg/dL    Assessment & Plan:   #1 hypertension improved with recent increase in telmisartan  from 40 to 80 mg.  Patient also remains on HCTZ and amlodipine .  Continue low-sodium diet.  Continue periodic home monitoring.  #2 hyperlipidemia.  Treated with rosuvastatin .  Last lipids were checked and November.  Recommend setting up follow-up physical around November and will reassess labs then  #3 prediabetes.  Recent A1c 6.1%.  Continue low glycemic diet and weight control.  Wolm Scarlet, MD

## 2024-04-07 ENCOUNTER — Other Ambulatory Visit: Payer: Self-pay | Admitting: Family Medicine

## 2024-05-30 ENCOUNTER — Other Ambulatory Visit: Payer: Self-pay | Admitting: Family Medicine

## 2024-06-05 ENCOUNTER — Other Ambulatory Visit: Payer: Self-pay | Admitting: Family Medicine

## 2024-07-18 ENCOUNTER — Ambulatory Visit: Admitting: Family Medicine

## 2024-07-18 ENCOUNTER — Encounter: Payer: Self-pay | Admitting: Family Medicine

## 2024-07-18 ENCOUNTER — Ambulatory Visit: Payer: Self-pay | Admitting: Family Medicine

## 2024-07-18 VITALS — BP 130/64 | HR 71 | Temp 98.1°F | Ht 72.0 in | Wt 286.0 lb

## 2024-07-18 DIAGNOSIS — Z0000 Encounter for general adult medical examination without abnormal findings: Secondary | ICD-10-CM

## 2024-07-18 LAB — BASIC METABOLIC PANEL WITH GFR
BUN: 17 mg/dL (ref 6–23)
CO2: 28 meq/L (ref 19–32)
Calcium: 9.2 mg/dL (ref 8.4–10.5)
Chloride: 104 meq/L (ref 96–112)
Creatinine, Ser: 0.96 mg/dL (ref 0.40–1.50)
GFR: 83.61 mL/min (ref >60.00)
Glucose, Bld: 116 mg/dL — ABNORMAL HIGH (ref 70–99)
Potassium: 4 meq/L (ref 3.5–5.1)
Sodium: 139 meq/L (ref 135–145)

## 2024-07-18 LAB — LIPID PANEL
Cholesterol: 107 mg/dL (ref 0–200)
HDL: 38.9 mg/dL — ABNORMAL LOW (ref >39.00)
LDL Cholesterol: 51 mg/dL (ref 0–99)
NonHDL: 68.48
Total CHOL/HDL Ratio: 3
Triglycerides: 85 mg/dL (ref 0.0–149.0)
VLDL: 17 mg/dL (ref 0.0–40.0)

## 2024-07-18 LAB — HEPATIC FUNCTION PANEL
ALT: 21 U/L (ref 0–53)
AST: 17 U/L (ref 0–37)
Albumin: 4.3 g/dL (ref 3.5–5.2)
Alkaline Phosphatase: 80 U/L (ref 39–117)
Bilirubin, Direct: 0.1 mg/dL (ref 0.0–0.3)
Total Bilirubin: 0.7 mg/dL (ref 0.2–1.2)
Total Protein: 7 g/dL (ref 6.0–8.3)

## 2024-07-18 LAB — CBC WITH DIFFERENTIAL/PLATELET
Basophils Absolute: 0 K/uL (ref 0.0–0.1)
Basophils Relative: 0.6 % (ref 0.0–3.0)
Eosinophils Absolute: 0.2 K/uL (ref 0.0–0.7)
Eosinophils Relative: 4.1 % (ref 0.0–5.0)
HCT: 47.1 % (ref 39.0–52.0)
Hemoglobin: 15.7 g/dL (ref 13.0–17.0)
Lymphocytes Relative: 34.5 % (ref 12.0–46.0)
Lymphs Abs: 1.8 K/uL (ref 0.7–4.0)
MCHC: 33.3 g/dL (ref 30.0–36.0)
MCV: 84.4 fl (ref 78.0–100.0)
Monocytes Absolute: 0.5 K/uL (ref 0.1–1.0)
Monocytes Relative: 10.4 % (ref 3.0–12.0)
Neutro Abs: 2.6 K/uL (ref 1.4–7.7)
Neutrophils Relative %: 50.4 % (ref 43.0–77.0)
Platelets: 167 K/uL (ref 150.0–400.0)
RBC: 5.58 Mil/uL (ref 4.22–5.81)
RDW: 14.2 % (ref 11.5–15.5)
WBC: 5.1 K/uL (ref 4.0–10.5)

## 2024-07-18 LAB — HEMOGLOBIN A1C: Hgb A1c MFr Bld: 5.9 % (ref 4.6–6.5)

## 2024-07-18 NOTE — Progress Notes (Signed)
 Established Patient Office Visit  Subjective   Patient ID: Paul [name], male    DOB: May 31, 1961  Age: 63 y.o. MRN: [id number]  Chief Complaint  Patient presents with   Annual Exam    HPI   Paul [name] is seen today for physical exam.  He has history of hypertension.  We recently increased his blood pressure medication and blood pressures have been improved since then.  He also has history of elevated PSA followed by urology.  He had history of GERD and hyperlipidemia as well as prediabetes.  No recent chest pains. Generally doing well.  His wife had fall with multiple injuries last summer and is slowly recovering from that.  He stays very busy helping care for his 37 year old father along with his wife who is O2 dependent.  Health maintenance reviewed:  Health Maintenance  Topic Date Due   HIV Screening  Never done   Zoster Vaccines- Shingrix (1 of 2) Never done   COVID-19 Vaccine (7 - Pfizer risk 2025-26 season) 11/29/2024   DTaP/Tdap/Td (4 - Td or Tdap) 06/02/2028   Colonoscopy  08/08/2033   Pneumococcal Vaccine: 50+ Years  Completed   Influenza Vaccine  Completed   Hepatitis C Screening  Completed   Hepatitis B Vaccines 19-59 Average Risk  Aged Out   HPV VACCINES  Aged Out   Meningococcal B Vaccine  Aged Out   -Immunizations up-to-date with exception of shingles vaccine.  Social history-married.  Never smoked.  No alcohol.  No children.  He is retired.  Family history-mother died age 89 in her sleep .  Exact cause of death unknown.  She had hypertension history.  Father is alive age 45 with history of hypertension.  He has a sister with prediabetes.  Half brother died of Huntington's disease age 43.  Huntington's disease does not run in patient's biologic side.  Past Medical History:  Diagnosis Date   Allergy    seasonal allergies   Arthritis    on meds   Cancer (HCC)    skin   Cervicalgia 12/17/2009   GERD 11/20/2008   on meds   HYPERLIPIDEMIA 11/20/2008   on  meds   Hypertension    on meds   OBESITY 01/24/2009   PERIPHERAL VASCULAR DISEASE 01/24/2009   PREDIABETES 06/12/2010   Prostate CA (HCC) 2021   dx 05/13/2020   Prostate cancer (HCC) 2022   RHINITIS 04/03/2009   SHOULDER PAIN 11/26/2009   Past Surgical History:  Procedure Laterality Date   BACK SURGERY  02/29/2012   lumbar fusion per Dr. Royden Schneider in [place]    COLONOSCOPY  2021   TA, SSP and HPP, sutab  adequate prep   KNEE SURGERY Right    SHOULDER SURGERY Left    WISDOM TOOTH EXTRACTION      reports that he has never smoked. He has never used smokeless tobacco. He reports that he does not currently use alcohol. He reports that he does not use drugs. family history includes Cancer in his paternal grandfather; Heart disease in his maternal uncle; Hyperlipidemia in his mother; Hypertension in his father and mother. Allergies  Allergen Reactions   Amoxicillin -Pot Clavulanate Diarrhea   Grass Pollen(K-O-R-T-Swt Vern)    Atorvastatin Other (See Comments)    REACTION: myalgia   Cephalexin Nausea And Vomiting and Nausea Only   Oxycodone Hcl Nausea Only     Review of Systems  Constitutional:  Negative for chills, fever, malaise/fatigue and weight loss.  HENT:  Negative for hearing  loss.   Eyes:  Negative for blurred vision and double vision.  Respiratory:  Negative for cough and shortness of breath.   Cardiovascular:  Negative for chest pain, palpitations and leg swelling.  Gastrointestinal:  Negative for abdominal pain, blood in stool, constipation and diarrhea.  Genitourinary:  Negative for dysuria.  Skin:  Negative for rash.  Neurological:  Negative for dizziness, speech change, seizures, loss of consciousness and headaches.  Psychiatric/Behavioral:  Negative for depression.       Objective:     BP 130/64   Pulse 71   Temp 98.1 F (36.7 C) (Oral)   Ht 6' (1.829 m)   Wt 286 lb (129.7 kg)   SpO2 97%   BMI 38.79 kg/m  BP Readings from Last 3 Encounters:   07/18/24 130/64  04/03/24 130/60  01/23/24 (!) 142/62   Wt Readings from Last 3 Encounters:  07/18/24 286 lb (129.7 kg)  04/03/24 280 lb 9.6 oz (127.3 kg)  01/23/24 278 lb 3.2 oz (126.2 kg)      Physical Exam Vitals reviewed.  Constitutional:      General: He is not in acute distress.    Appearance: He is well-developed. He is not ill-appearing.  HENT:     Head: Normocephalic and atraumatic.     Ears:     Comments: Small amount of cerumen in both canals but not obstructing Eyes:     Conjunctiva/sclera: Conjunctivae normal.     Pupils: Pupils are equal, round, and reactive to light.  Neck:     Thyroid : No thyromegaly.  Cardiovascular:     Rate and Rhythm: Normal rate and regular rhythm.     Heart sounds: Normal heart sounds. No murmur heard. Pulmonary:     Effort: No respiratory distress.     Breath sounds: No wheezing or rales.  Abdominal:     General: Bowel sounds are normal. There is no distension.     Palpations: Abdomen is soft. There is no mass.     Tenderness: There is no abdominal tenderness. There is no guarding or rebound.  Musculoskeletal:     Cervical back: Normal range of motion and neck supple.     Right lower leg: No edema.     Left lower leg: No edema.  Lymphadenopathy:     Cervical: No cervical adenopathy.  Skin:    Findings: No rash.  Neurological:     Mental Status: He is alert and oriented to person, place, and time.     Cranial Nerves: No cranial nerve deficit.      No results found for any visits on 07/18/24.    The ASCVD Risk score (Arnett DK, et al., 2019) failed to calculate for the following reasons:   The valid total cholesterol range is 130 to 320 mg/dL    Assessment & Plan:   Problem List Items Addressed This Visit   None Visit Diagnoses       Physical exam    -  Primary   Relevant Orders   Basic metabolic panel with GFR   Lipid panel   CBC with Differential/Platelet   Hepatic function panel   Hemoglobin A60c      63 year old male here for physical exam.  Discussed importance of reducing high glycemic foods both in terms of losing weight and controlling diabetes along with regular physical exercise.  -Obtain lab work as above - Did not obtain PSA since he is followed by urology - Consider Shingrix vaccine and he will consider at  some point this year. - Colonoscopy and other age-appropriate screenings up-to-date at this time  No follow-ups on file.    Wolm Scarlet, MD

## 2024-07-18 NOTE — Patient Instructions (Signed)
 Consider Shingles vaccine at some point in the next year.

## 2024-07-28 ENCOUNTER — Other Ambulatory Visit: Payer: Self-pay | Admitting: Family Medicine

## 2024-07-28 DIAGNOSIS — I10 Essential (primary) hypertension: Secondary | ICD-10-CM

## 2024-09-06 ENCOUNTER — Other Ambulatory Visit: Payer: Self-pay | Admitting: Family Medicine
